# Patient Record
Sex: Female | Born: 1955 | Race: White | Hispanic: No | Marital: Married | Smoking: Never smoker
Health system: Southern US, Community
[De-identification: ages and names within clinical notes are randomized; demographics above are authoritative.]

## PROBLEM LIST (undated history)

## (undated) DIAGNOSIS — G4730 Sleep apnea, unspecified: Secondary | ICD-10-CM

## (undated) DIAGNOSIS — E785 Hyperlipidemia, unspecified: Secondary | ICD-10-CM

## (undated) DIAGNOSIS — I10 Essential (primary) hypertension: Secondary | ICD-10-CM

## (undated) HISTORY — DX: Sleep apnea, unspecified: G47.30

## (undated) HISTORY — DX: Hyperlipidemia, unspecified: E78.5

## (undated) HISTORY — PX: CHOLECYSTECTOMY: SHX55

## (undated) HISTORY — DX: Morbid (severe) obesity due to excess calories: E66.01

## (undated) HISTORY — PX: CATARACT EXTRACTION: SUR2

## (undated) HISTORY — DX: Essential (primary) hypertension: I10

## (undated) HISTORY — PX: OTHER SURGICAL HISTORY: SHX169

---

## 2002-03-22 DIAGNOSIS — E785 Hyperlipidemia, unspecified: Secondary | ICD-10-CM

## 2002-03-22 DIAGNOSIS — I10 Essential (primary) hypertension: Secondary | ICD-10-CM

## 2002-03-22 HISTORY — DX: Hyperlipidemia, unspecified: E78.5

## 2002-03-22 HISTORY — DX: Essential (primary) hypertension: I10

## 2003-01-10 ENCOUNTER — Other Ambulatory Visit: Admission: RE | Admit: 2003-01-10 | Discharge: 2003-01-10 | Payer: Self-pay | Admitting: Family Medicine

## 2004-01-15 ENCOUNTER — Ambulatory Visit: Payer: Self-pay | Admitting: Family Medicine

## 2004-01-21 ENCOUNTER — Ambulatory Visit: Payer: Self-pay | Admitting: Professional

## 2004-02-07 ENCOUNTER — Ambulatory Visit: Payer: Self-pay | Admitting: Family Medicine

## 2004-02-11 ENCOUNTER — Ambulatory Visit: Payer: Self-pay | Admitting: Professional

## 2004-04-02 ENCOUNTER — Ambulatory Visit: Payer: Self-pay | Admitting: Family Medicine

## 2004-04-09 ENCOUNTER — Ambulatory Visit: Payer: Self-pay | Admitting: Family Medicine

## 2004-05-28 ENCOUNTER — Ambulatory Visit: Payer: Self-pay | Admitting: Family Medicine

## 2004-09-03 ENCOUNTER — Ambulatory Visit: Payer: Self-pay | Admitting: Family Medicine

## 2005-06-02 ENCOUNTER — Ambulatory Visit: Payer: Self-pay | Admitting: Family Medicine

## 2005-11-02 ENCOUNTER — Encounter: Payer: Self-pay | Admitting: Family Medicine

## 2005-11-02 ENCOUNTER — Other Ambulatory Visit: Admission: RE | Admit: 2005-11-02 | Discharge: 2005-11-02 | Payer: Self-pay | Admitting: Family Medicine

## 2005-11-02 ENCOUNTER — Encounter (INDEPENDENT_AMBULATORY_CARE_PROVIDER_SITE_OTHER): Payer: Self-pay | Admitting: *Deleted

## 2005-11-02 ENCOUNTER — Ambulatory Visit: Payer: Self-pay | Admitting: Family Medicine

## 2007-01-03 ENCOUNTER — Encounter (INDEPENDENT_AMBULATORY_CARE_PROVIDER_SITE_OTHER): Payer: Self-pay | Admitting: *Deleted

## 2007-10-25 ENCOUNTER — Encounter (INDEPENDENT_AMBULATORY_CARE_PROVIDER_SITE_OTHER): Payer: Self-pay | Admitting: *Deleted

## 2008-11-20 ENCOUNTER — Ambulatory Visit: Payer: Self-pay | Admitting: Family Medicine

## 2008-11-20 ENCOUNTER — Other Ambulatory Visit: Admission: RE | Admit: 2008-11-20 | Discharge: 2008-11-20 | Payer: Self-pay | Admitting: Family Medicine

## 2008-11-20 ENCOUNTER — Encounter: Payer: Self-pay | Admitting: Family Medicine

## 2008-11-20 ENCOUNTER — Encounter (INDEPENDENT_AMBULATORY_CARE_PROVIDER_SITE_OTHER): Payer: Self-pay | Admitting: *Deleted

## 2008-11-20 DIAGNOSIS — E785 Hyperlipidemia, unspecified: Secondary | ICD-10-CM

## 2008-11-20 DIAGNOSIS — R739 Hyperglycemia, unspecified: Secondary | ICD-10-CM

## 2008-11-22 LAB — CONVERTED CEMR LAB
AST: 59 units/L — ABNORMAL HIGH (ref 0–37)
Alkaline Phosphatase: 64 units/L (ref 39–117)
Basophils Absolute: 0.1 10*3/uL (ref 0.0–0.1)
Bilirubin, Direct: 0 mg/dL (ref 0.0–0.3)
CO2: 28 meq/L (ref 19–32)
Calcium: 9.1 mg/dL (ref 8.4–10.5)
Cholesterol: 166 mg/dL (ref 0–200)
Creatinine, Ser: 0.9 mg/dL (ref 0.4–1.2)
Creatinine,U: 157.7 mg/dL
Direct LDL: 96.7 mg/dL
Eosinophils Absolute: 0.2 10*3/uL (ref 0.0–0.7)
GFR calc non Af Amer: 69.15 mL/min (ref >60)
Glucose, Bld: 219 mg/dL — ABNORMAL HIGH (ref 70–99)
HDL: 37 mg/dL — ABNORMAL LOW (ref >39.00)
Lymphocytes Relative: 25.6 % (ref 12.0–46.0)
MCHC: 34.3 g/dL (ref 30.0–36.0)
Microalb Creat Ratio: 19.7 mg/g (ref 0.0–30.0)
Microalb, Ur: 3.1 mg/dL — ABNORMAL HIGH (ref 0.0–1.9)
Monocytes Relative: 5.4 % (ref 3.0–12.0)
Neutrophils Relative %: 65.1 % (ref 43.0–77.0)
RBC: 4.78 M/uL (ref 3.87–5.11)
RDW: 13.6 % (ref 11.5–14.6)
Sodium: 139 meq/L (ref 135–145)
Total CHOL/HDL Ratio: 4
Triglycerides: 253 mg/dL — ABNORMAL HIGH (ref 0.0–149.0)

## 2008-11-27 ENCOUNTER — Encounter (INDEPENDENT_AMBULATORY_CARE_PROVIDER_SITE_OTHER): Payer: Self-pay | Admitting: *Deleted

## 2008-12-02 ENCOUNTER — Ambulatory Visit: Payer: Self-pay | Admitting: Family Medicine

## 2008-12-04 ENCOUNTER — Encounter: Payer: Self-pay | Admitting: Family Medicine

## 2008-12-11 ENCOUNTER — Encounter (INDEPENDENT_AMBULATORY_CARE_PROVIDER_SITE_OTHER): Payer: Self-pay | Admitting: *Deleted

## 2009-02-24 ENCOUNTER — Ambulatory Visit: Payer: Self-pay | Admitting: Family Medicine

## 2009-02-25 LAB — CONVERTED CEMR LAB
AST: 25 units/L (ref 0–37)
CO2: 29 meq/L (ref 19–32)
Chloride: 104 meq/L (ref 96–112)
Cholesterol: 137 mg/dL (ref 0–200)
Creatinine, Ser: 0.8 mg/dL (ref 0.4–1.2)
GFR calc non Af Amer: 79.14 mL/min (ref >60)
Hgb A1c MFr Bld: 7.7 % — ABNORMAL HIGH (ref 4.6–6.5)
Potassium: 4.3 meq/L (ref 3.5–5.1)
Sodium: 141 meq/L (ref 135–145)

## 2010-02-06 ENCOUNTER — Ambulatory Visit: Payer: Self-pay | Admitting: Family Medicine

## 2010-02-09 LAB — CONVERTED CEMR LAB
ALT: 26 units/L (ref 0–35)
Albumin: 3.7 g/dL (ref 3.5–5.2)
Alkaline Phosphatase: 62 units/L (ref 39–117)
Bilirubin, Direct: 0.1 mg/dL (ref 0.0–0.3)
Calcium: 8.9 mg/dL (ref 8.4–10.5)
Cholesterol: 153 mg/dL (ref 0–200)
Glucose, Bld: 289 mg/dL — ABNORMAL HIGH (ref 70–99)
HDL: 38 mg/dL — ABNORMAL LOW (ref >39.00)
Potassium: 4.2 meq/L (ref 3.5–5.1)
Total Protein: 6.9 g/dL (ref 6.0–8.3)

## 2010-04-21 NOTE — Assessment & Plan Note (Signed)
Summary: MED REFILL/RI   Vital Signs:  Patient profile:   55 year old female Height:      67 inches Weight:      320.75 pounds BMI:     50.42 Temp:     98.2 degrees F oral Pulse rate:   84 / minute Pulse rhythm:   regular BP sitting:   124 / 68  (left arm) Cuff size:   large  Vitals Entered By: Lewanda Rife LPN (February 06, 2010 8:10 AM) CC: med refill   History of Present Illness: here for f/u of DM and lipids and obesity   wt is down 14 lb is more active and cutting back on diet  got rid of sweet tea    DM - is not checking diet  glucophage gave her diarrhea  has not been proactive about her diabetes  was in denial for a while  is not so much now   no numbness no blurry vision  has not had eye exam in past year (sees Dr Clydene Pugh)     124/68  got flu shot today   is doing well overall- feels good       Allergies (verified): No Known Drug Allergies  Past History:  Past Medical History: Last updated: 11/20/2008 Hypertension (2004) Diabetes mellitus, type II (2004) Hyperlipidemia (2004) Obesity- morbid family hx colon cancer/ and pt declines colonosc 2010  Past Surgical History: Last updated: 11/20/2008 C-S  Family History: Last updated: 11/20/2008 Father- Diabetes Mother- Diabetes, Colon CA, Schizophrenia, Died of COPD (wasSmoker) Brother- Diabetes Sister- Ovarian CA in her 64's Grandfather- ETOH  Social History: Last updated: 11/20/2008 Occupation: [employer] Secretary Never Smoked Alcohol use-no Drug use-no Married  Risk Factors: Smoking Status: never (11/20/2008)  Review of Systems General:  Denies fatigue, loss of appetite, and malaise. Eyes:  Denies blurring and eye irritation. CV:  Denies chest pain or discomfort, palpitations, and shortness of breath with exertion. Resp:  Denies cough, shortness of breath, and wheezing. GI:  Denies abdominal pain and change in bowel habits. MS:  Denies joint pain and joint swelling. Derm:   Denies itching, lesion(s), poor wound healing, and rash. Neuro:  Denies numbness and tingling. Psych:  mood is fair . Endo:  Denies excessive thirst and excessive urination. Heme:  Denies abnormal bruising and bleeding.  Physical Exam  General:  morbidly obese, well appearing Head:  normocephalic, atraumatic, and no abnormalities observed.   Eyes:  vision grossly intact, pupils equal, pupils round, and pupils reactive to light.   Mouth:  pharynx pink and moist.   Neck:  supple with full rom and no masses or thyromegally, no JVD or carotid bruit  Lungs:  Normal respiratory effort, chest expands symmetrically. Lungs are clear to auscultation, no crackles or wheezes. Heart:  Normal rate and regular rhythm. S1 and S2 normal without gallop, murmur, click, rub or other extra sounds. Abdomen:  Bowel sounds positive,abdomen soft and non-tender without masses, organomegaly or hernias noted. no renal bruits  Msk:  No deformity or scoliosis noted of thoracic or lumbar spine.   Pulses:  R and L carotid,radial,femoral,dorsalis pedis and posterior tibial pulses are full and equal bilaterally Extremities:  No clubbing, cyanosis, edema, or deformity noted with normal full range of motion of all joints.   Neurologic:  sensation intact to light touch, gait normal, and DTRs symmetrical and normal.   Skin:  very dry skin on feet Cervical Nodes:  No lymphadenopathy noted Inguinal Nodes:  No significant adenopathy Psych:  seems  mildly frustrated but overall affect is normal  Diabetes Management Exam:    Foot Exam (with socks and/or shoes not present):       Sensory-Pinprick/Light touch:          Left medial foot (L-4): normal          Left dorsal foot (L-5): normal          Left lateral foot (S-1): normal          Right medial foot (L-4): normal          Right dorsal foot (L-5): normal          Right lateral foot (S-1): normal       Sensory-Monofilament:          Left foot: normal          Right foot:  normal       Inspection:          Left foot: normal          Right foot: normal       Nails:          Left foot: normal          Right foot: normal   Impression & Recommendations:  Problem # 1:  HYPERLIPIDEMIA (ICD-272.4) Assessment Unchanged  diet fair with some wt loss check labs on crestor disc goals for DM  rev low sat fat diet  f/u in 3 mo Her updated medication list for this problem includes:    Crestor 10 Mg Tabs (Rosuvastatin calcium) ..... One by mouth once daily  Orders: Venipuncture (16109) TLB-Lipid Panel (80061-LIPID) TLB-Renal Function Panel (80069-RENAL) TLB-Hepatic/Liver Function Pnl (80076-HEPATIC) TLB-A1C / Hgb A1C (Glycohemoglobin) (83036-A1C) Prescription Created Electronically [id number])  Labs Reviewed: SGOT: 25 (02/24/2009)   SGPT: 23 (02/24/2009)   HDL:39.80 (02/24/2009), 37.00 (11/20/2008)  LDL:71 (02/24/2009)  Chol:137 (02/24/2009), 166 (11/20/2008)  Trig:133.0 (02/24/2009), 253.0 (11/20/2008)  Problem # 2:  DIABETES MELLITUS, TYPE II (ICD-250.00) Assessment: Deteriorated pt has been in denial about DM, stopped med due to diarrhea (but did loose 14 lb with better diet) AIC today on ace  reminded to get eye exam  change to glucotrol xl check sugar once daily -- see inst  f/u 3 mo  The following medications were removed from the medication list:    Glucophage 500 Mg Tabs (Metformin hcl) .Marland Kitchen... 1 by mouth two times a day Her updated medication list for this problem includes:    Lisinopril 5 Mg Tabs (Lisinopril) ..... One by mouth daily    Glucotrol Xl 5 Mg Xr24h-tab (Glipizide) .Marland Kitchen... 1 by mouth once daily  Orders: Venipuncture (09811) TLB-Lipid Panel (80061-LIPID) TLB-Renal Function Panel (80069-RENAL) TLB-Hepatic/Liver Function Pnl (80076-HEPATIC) TLB-A1C / Hgb A1C (Glycohemoglobin) (83036-A1C) Prescription Created Electronically [id number])  Complete Medication List: 1)  Lisinopril 5 Mg Tabs (Lisinopril) .... One by mouth daily 2)  Crestor 10  Mg Tabs (Rosuvastatin calcium) .... One by mouth once daily 3)  Glucotrol Xl 5 Mg Xr24h-tab (Glipizide) .Marland Kitchen.. 1 by mouth once daily  Other Orders: Admin 1st Vaccine (29562) Flu Vaccine 54yrs + [phone])  Patient Instructions: 1)  don't forget to make your appt with eye doctor for your annual diabetic eye exam  2)  try to stick to diabetic diet  3)  It is important that you exercise reguarly at least 20 minutes 5 times a week. If you develop chest pain, have severe difficulty breathing, or feel very tired, stop exercising immediately and seek medical attention.  4)  stay  off the metformin  5)  start the glucotrol XL 5 mg each am  6)  check sugar in am fasting MWF and 2 hours after a meal on tues/ thurs/ sat /sunday 7)  bring blood sugar readings at follow up  8)  flu shot today 9)  labs today 10)  schedule non fasting lab in 3 months and then follow up AIC and renal Prescriptions: GLUCOTROL XL 5 MG XR24H-TAB (GLIPIZIDE) 1 by mouth once daily  #30 x 3   Entered and Authorized by:   Marne Ann Tower MD   Signed by:   Marne Ann Tower MD on 02/06/2010   Method used:   [facility]  [address] [id number]       [address]       [place]       [place], [place]  [address]       Ph: [phone]       Fax: [phone]   RxID:   [id number] CRESTOR 10 MG TABS (ROSUVASTATIN CALCIUM) one by mouth once daily  #90 x 3   Entered and Authorized by:   Marne Ann Tower MD   Signed by:   Marne Ann Tower MD on 02/06/2010   Method used:   [facility]        [facility]  [address] [id number]       [address]       [place]       [place], [place]  [address]       Ph: [phone]       Fax: [phone]   RxID:   [id number] LISINOPRIL 5 MG TABS (LISINOPRIL) one by mouth daily  #90 x 3   Entered and Authorized by:   Marne Ann Tower MD   Signed by:   Marne Ann Tower MD on 02/06/2010   Method used:   [facility]        [facility]  [address] [id number]       [address]       [place], [place]  [address]       Ph: [phone]       Fax: [phone]   RxID:   [id number]    Orders Added: 1)  Venipuncture [44010] 2)  TLB-Lipid Panel [80061-LIPID] 3)  TLB-Renal Function Panel [80069-RENAL] 4)  TLB-Hepatic/Liver Function Pnl [80076-HEPATIC] 5)  TLB-A1C / Hgb A1C (Glycohemoglobin) [83036-A1C] 6)  Admin 1st Vaccine [90471] 7)  Flu Vaccine 50yrs + [[address]] 8)  Prescription Created Electronically [G8553] 9)  Est. Patient Level IV [66440]    Current Allergies (reviewed today): No known allergies  Flu Vaccine Consent Questions     Do you have a history of severe allergic reactions to this vaccine? no    Any prior history of allergic reactions to egg and/or gelatin? no    Do you have a sensitivity to the preservative Thimersol? no    Do you have a past history of Guillan-Barre Syndrome? no    Do you currently have an acute febrile illness? no    Have you ever had a severe reaction to latex? no    Vaccine information given and explained to patient? yes    Are you currently pregnant? no    Lot Number:[id number]   Exp Date:09/19/2010   Site Given  Left Deltoid IM Lewanda Rife LPN  February 06, 2010 9:15 AM  .[email or web address]

## 2010-05-05 ENCOUNTER — Encounter (INDEPENDENT_AMBULATORY_CARE_PROVIDER_SITE_OTHER): Payer: Self-pay | Admitting: *Deleted

## 2010-05-05 ENCOUNTER — Other Ambulatory Visit: Payer: Self-pay | Admitting: Family Medicine

## 2010-05-05 ENCOUNTER — Other Ambulatory Visit (INDEPENDENT_AMBULATORY_CARE_PROVIDER_SITE_OTHER): Payer: BC Managed Care – PPO

## 2010-05-05 DIAGNOSIS — E119 Type 2 diabetes mellitus without complications: Secondary | ICD-10-CM

## 2010-05-05 DIAGNOSIS — E785 Hyperlipidemia, unspecified: Secondary | ICD-10-CM

## 2010-05-05 LAB — RENAL FUNCTION PANEL
Albumin: 3.4 g/dL — ABNORMAL LOW (ref 3.5–5.2)
Calcium: 9 mg/dL (ref 8.4–10.5)
Creatinine, Ser: 0.7 mg/dL (ref 0.4–1.2)
Glucose, Bld: 203 mg/dL — ABNORMAL HIGH (ref 70–99)
Sodium: 139 mEq/L (ref 135–145)

## 2010-05-12 ENCOUNTER — Ambulatory Visit: Payer: Self-pay | Admitting: Family Medicine

## 2010-05-14 ENCOUNTER — Ambulatory Visit: Payer: Self-pay | Admitting: Family Medicine

## 2010-05-15 ENCOUNTER — Ambulatory Visit: Payer: Self-pay | Admitting: Family Medicine

## 2010-05-25 ENCOUNTER — Ambulatory Visit (INDEPENDENT_AMBULATORY_CARE_PROVIDER_SITE_OTHER): Payer: BC Managed Care – PPO | Admitting: Family Medicine

## 2010-05-25 ENCOUNTER — Encounter: Payer: Self-pay | Admitting: Family Medicine

## 2010-05-25 DIAGNOSIS — E119 Type 2 diabetes mellitus without complications: Secondary | ICD-10-CM

## 2010-06-09 NOTE — Assessment & Plan Note (Signed)
Summary: FOLLOW UP LABS/RI R/S FROM 05/14/10   Vital Signs:  Patient profile:   55 year old female Height:      67 inches Weight:      326.25 pounds BMI:     51.28 Temp:     98.4 degrees F oral Pulse rate:   76 / minute Pulse rhythm:   regular BP sitting:   134 / 68  (left arm) Cuff size:   large  Vitals Entered By: Lewanda Rife LPN (May 24, 1608 8:35 AM)  Serial Vital Signs/Assessments:  Time      Position  BP       Pulse  Resp  Temp     By                     130/65                         Judith Part MD  CC: follow-up visit after labs, Lipid Management   History of Present Illness: here for f/u of DM   is feeling good   wt is up 6 lb  bp is 134/68 on first check  sugar 203 on fasting check AIC is 8.9 down from 11 last check !  on glucotrol xl 5 mg now (non tol of metformin)  is checking sugars intermittenty -- avg 168-200   is doing better with diet than 6 months ago  thinks she is 40 % of the way to a diabetic diet still having trouble with motivation  eating out is her biggest downfall    is walking at work now -- every day -- 20-30 minutes (5 days per week)  more than 10 people doing it  plans to do a charity walk for [place] in april  she can do it and gaining confidence   has not had her eye exam yet -- is getting new ins at work   Lipid Management History:      Positive NCEP/ATP III risk factors include female age 73 years old or older, diabetes, HDL cholesterol less than 40, and hypertension.  Negative NCEP/ATP III risk factors include non-tobacco-user status.    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 Eyes:  Denies blurring and eye irritation. CV:  Denies chest pain or discomfort, lightheadness, and palpitations. Resp:  Denies cough, shortness of breath, and wheezing. GI:  Denies abdominal pain, change in bowel habits, indigestion, and nausea. MS:  Denies muscle aches, cramps, and muscle weakness. Derm:  Denies poor wound healing and rash. Neuro:  Denies numbness and tingling. Heme:  Denies abnormal bruising and bleeding.  Physical Exam  General:  morbidly obese, well appearing Head:  normocephalic, atraumatic, and no abnormalities observed.   Eyes:  vision grossly intact, pupils equal, pupils round, and pupils reactive to light.   Mouth:  pharynx pink and  moist.   Neck:  supple with full rom and no masses or thyromegally, no JVD or carotid bruit  Lungs:  Normal respiratory effort, chest expands symmetrically. Lungs are clear to auscultation, no crackles or wheezes. Heart:  Normal rate and regular rhythm. S1 and S2 normal without gallop, murmur, click, rub or other extra sounds. Pulses:  R and L carotid,radial,femoral,dorsalis pedis and posterior tibial pulses are full and equal bilaterally Extremities:  No clubbing, cyanosis, edema, or deformity noted with normal full range of motion of all joints.   Neurologic:  sensation intact to light touch, gait normal, and DTRs symmetrical and normal.   Skin:  Intact without suspicious lesions or rashes Cervical Nodes:  No lymphadenopathy noted Psych:  normal affect, talkative and pleasant   Diabetes Management Exam:    Foot Exam (with socks and/or shoes  not present):       Sensory-Pinprick/Light touch:          Left medial foot (L-4): normal          Left dorsal foot (L-5): normal          Left lateral foot (S-1): normal          Right medial foot (L-4): normal          Right dorsal foot (L-5): normal          Right lateral foot (S-1): normal       Sensory-Monofilament:          Left foot: normal          Right foot: normal       Inspection:          Left foot: normal          Right foot: normal       Nails:          Left foot: normal          Right foot: normal   Impression & Recommendations:  Problem # 1:  DIABETES MELLITUS, TYPE II (ICD-250.00) Assessment Improved  this is improving - still not at goal inc glucotrol to 10  again long conversation about diet and accountability  great exercise- urged to keep it up  pt will make own opthy appt  f/u 3 mo after labs  Her updated medication list for this problem includes:    Lisinopril 5 Mg Tabs (Lisinopril) ..... One by mouth daily    Glucotrol Xl 10 Mg Xr24h-tab (Glipizide) .Marland Kitchen... 1 by mouth once daily  Labs Reviewed: Creat: 0.7 (05/05/2010)    Reviewed HgBA1c results: 8.9 (05/05/2010)  11.0 (02/06/2010)  Orders: Prescription Created Electronically [id number])  Complete Medication List: 1)  Lisinopril 5 Mg Tabs (Lisinopril) .... One by mouth daily 2)  Crestor 10 Mg Tabs (Rosuvastatin calcium) .... One by mouth once daily 3)  Glucotrol Xl 10 Mg Xr24h-tab (Glipizide) .Marland Kitchen.. 1 by mouth once daily  Lipid Assessment/Plan:      Based on NCEP/ATP III, the patient's risk factor category is "history of diabetes".  The patient's lipid goals are as follows: Total cholesterol goal is 200; LDL cholesterol goal is 100; HDL cholesterol goal is 40; Triglyceride goal is 150.     Patient Instructions: 1)  please make sure to make an annual eye exam appt  2)  try your best to stick to diabetic diet  3)  stay accountable for what you eat  4)  be proud of your exercise and keep it up  5)   check sugar two  times a day -- am fasting and 2 hours after afternoon or evening meal  6)  keep a log 7)  you may also want to log of what you eat  8)  increase your glucotrol from 5 to 10 mg once per day  9)  schedule lab in 3 months AIC please and glucose 250.0 and then follow up  Prescriptions: GLUCOTROL XL 10 MG XR24H-TAB (GLIPIZIDE) 1 by mouth once daily  #30 x 11   Entered and Authorized by:   Judith Part MD   Signed by:   Judith Part MD on 05/25/2010   Method used:   [facility]        [phone][facility] ([facility]ail[facility]       [address]       [place], [place]  [address]       Ph: [phone]       Fax: [phone]   RxID:   [id number]    Orders Added: 1)  Est. Patient Level III [24401] 2)  Prescription Created Electronically [id number]    Current Allergies (reviewed today): METFORMIN HCL

## 2010-06-10 ENCOUNTER — Other Ambulatory Visit: Payer: Self-pay | Admitting: Family Medicine

## 2010-06-12 NOTE — Telephone Encounter (Signed)
Pt seen on 05/25/10 and Glipizide 5mg  was d/c. New rx for Glipizide 10mg  [facility] and [facility] 5mg .

## 2010-08-30 ENCOUNTER — Encounter: Payer: Self-pay | Admitting: Family Medicine

## 2010-09-01 ENCOUNTER — Other Ambulatory Visit: Payer: Self-pay | Admitting: Family Medicine

## 2010-09-03 ENCOUNTER — Other Ambulatory Visit (INDEPENDENT_AMBULATORY_CARE_PROVIDER_SITE_OTHER): Payer: BC Managed Care – PPO

## 2010-09-03 DIAGNOSIS — E119 Type 2 diabetes mellitus without complications: Secondary | ICD-10-CM

## 2010-09-03 LAB — GLUCOSE, RANDOM: Glucose, Bld: 203 mg/dL — ABNORMAL HIGH (ref 70–99)

## 2010-09-03 LAB — HEMOGLOBIN A1C: Hgb A1c MFr Bld: 9.2 % — ABNORMAL HIGH (ref 4.6–6.5)

## 2010-09-08 ENCOUNTER — Ambulatory Visit: Payer: BC Managed Care – PPO | Admitting: Family Medicine

## 2010-10-09 ENCOUNTER — Ambulatory Visit: Payer: BC Managed Care – PPO | Admitting: Family Medicine

## 2011-04-06 ENCOUNTER — Other Ambulatory Visit: Payer: Self-pay | Admitting: Internal Medicine

## 2011-04-06 MED ORDER — ROSUVASTATIN CALCIUM 10 MG PO TABS: 10.0000 mg | ORAL_TABLET | Freq: Every day | ORAL | Status: DC

## 2011-04-06 MED ORDER — LISINOPRIL 5 MG PO TABS: 5.0000 mg | ORAL_TABLET | Freq: Every day | ORAL | Status: DC

## 2011-04-06 NOTE — Telephone Encounter (Signed)
Will refill electronically  

## 2011-04-06 NOTE — Telephone Encounter (Signed)
Patient is out of meds and setup an appointment on 05/02/10 and wanted to know if she could get a refill.

## 2011-05-04 ENCOUNTER — Ambulatory Visit: Payer: BC Managed Care – PPO | Admitting: Family Medicine

## 2011-05-28 ENCOUNTER — Other Ambulatory Visit: Payer: Self-pay | Admitting: Family Medicine

## 2011-05-28 NOTE — Telephone Encounter (Signed)
[facility] Glipizide 10 mg 24 hr tab #30 x 0. Pt last seen 05/25/10 and already has scheduled appt 06/08/11 with Dr Milinda Antis. Dr Milinda Antis is out of office today.

## 2011-06-04 ENCOUNTER — Other Ambulatory Visit: Payer: Self-pay | Admitting: Family Medicine

## 2011-06-04 NOTE — Telephone Encounter (Signed)
[facility] crestor 10 mg #30 x 1 pt already has scheduled appt 06/08/11.

## 2011-06-08 ENCOUNTER — Ambulatory Visit: Payer: BC Managed Care – PPO | Admitting: Family Medicine

## 2011-06-14 ENCOUNTER — Ambulatory Visit (INDEPENDENT_AMBULATORY_CARE_PROVIDER_SITE_OTHER): Payer: BC Managed Care – PPO | Admitting: Family Medicine

## 2011-06-14 ENCOUNTER — Encounter: Payer: Self-pay | Admitting: Family Medicine

## 2011-06-14 VITALS — BP 122/82 | HR 82 | Temp 97.7°F | Ht 68.0 in | Wt 330.2 lb

## 2011-06-14 DIAGNOSIS — H6120 Impacted cerumen, unspecified ear: Secondary | ICD-10-CM

## 2011-06-14 DIAGNOSIS — E119 Type 2 diabetes mellitus without complications: Secondary | ICD-10-CM

## 2011-06-14 DIAGNOSIS — E785 Hyperlipidemia, unspecified: Secondary | ICD-10-CM

## 2011-06-14 DIAGNOSIS — E669 Obesity, unspecified: Secondary | ICD-10-CM

## 2011-06-14 LAB — COMPREHENSIVE METABOLIC PANEL
ALT: 18 U/L (ref 0–35)
AST: 17 U/L (ref 0–37)
Albumin: 3.6 g/dL (ref 3.5–5.2)
Alkaline Phosphatase: 63 U/L (ref 39–117)
Glucose, Bld: 270 mg/dL — ABNORMAL HIGH (ref 70–99)
Potassium: 4 mEq/L (ref 3.5–5.1)
Sodium: 139 mEq/L (ref 135–145)
Total Protein: 7.2 g/dL (ref 6.0–8.3)

## 2011-06-14 LAB — LIPID PANEL
HDL: 43.9 mg/dL (ref >39.00)
LDL Cholesterol: 60 mg/dL (ref 0–99)
VLDL: 35.8 mg/dL (ref 0.0–40.0)

## 2011-06-14 MED ORDER — ROSUVASTATIN CALCIUM 10 MG PO TABS: 10.0000 mg | ORAL_TABLET | Freq: Every day | ORAL | Status: DC

## 2011-06-14 MED ORDER — LISINOPRIL 5 MG PO TABS: 5.0000 mg | ORAL_TABLET | Freq: Every day | ORAL | Status: DC

## 2011-06-14 MED ORDER — GLIPIZIDE ER 10 MG PO TB24: 10.0000 mg | ORAL_TABLET | Freq: Every day | ORAL | Status: DC

## 2011-06-14 NOTE — Patient Instructions (Signed)
I suspect your diabetes is poorly controlled  Check sugar am fasting and then 2 hours after evening meal  Keep a log and bring to next appt  Use debrox solution over the counter in both ears twice weekly to loosen ear wax Follow up in 2-3 weeks - we will rev labs/ sugars and flush your ears Aim for 5 days per week of exercise for 30 minutes  Work on weight loss

## 2011-06-14 NOTE — Assessment & Plan Note (Signed)
Pt inst to use debrox twice weekly and will irrigate ears at upcoming f/u  Is affecting her hearing

## 2011-06-14 NOTE — Assessment & Plan Note (Signed)
Suspect poor control Lab today  F/yu 2-3 wk with sugars Pt is not very motivated for lifestyle change Glipizide refilled

## 2011-06-14 NOTE — Assessment & Plan Note (Signed)
Has been fairly controlled with crestor and diet  Non compliant in general  Lab today Disc at f/u 2-3 wk

## 2011-06-14 NOTE — Assessment & Plan Note (Signed)
Discussed how this problem influences overall health and the risks it imposes  Reviewed plan for weight loss with lower calorie diet (via better food choices and also portion control or program like weight watchers) and exercise building up to or more than 30 minutes 5 days per week including some aerobic activity    Pt voices understanding - does not seem very motivated, however

## 2011-06-14 NOTE — Progress Notes (Signed)
Subjective:    Patient ID: Sara Glover, female    DOB: 1955-10-26, 56 y.o.   MRN: [id number]  HPI Here for f/u of chronic problems incl DM and chol and obesity Is doing well  Has wax build up in ears - trouble with it   Wt is up 4 lb  bmi of 50 - morbid obesity Lifestyle habits-- is not ready to make commitment  Has tried some programs    On glipizide ER  Diabetes Home sugar results - does not check sugars (has the equiptment)  DM diet -- eats about 1/2 the time  Exercise - not much at all  Symptoms-- none  A1C last - was in June 9.2 poor control  No problems with medications (pt cannot take metformin)  Renal protection- is on ace - lisinopril 5 mg  Last eye exam -was in the middle of last year imms- did not get flu shot this year Pneumovax - needs to get  Chol Lab Results  Component Value Date   CHOL 153 02/06/2010   HDL 38.00* 02/06/2010   LDLCALC 71 02/24/2009   LDLDIRECT 87.3 02/06/2010   TRIG 217.0* 02/06/2010   CHOLHDL 4 02/06/2010    On crestor- had been well controlled  Diet-- is fair overall  Overdue for labs   Planning to start walking at work  Has hallways to walk in for bad weather Also treadmill  Patient Active Problem List  Diagnoses  . HYPERLIPIDEMIA  . DIABETES MELLITUS, TYPE II  . Obesity  . Cerumen impaction   Past Medical History  Diagnosis Date  . Diabetes mellitus 2004    type II  . Hyperlipidemia 2004  . Hypertension 2004  . Morbid obesity    Past Surgical History  Procedure Date  . Cesarean section    History  Substance Use Topics  . Smoking status: Never Smoker   . Smokeless tobacco: Not on file  . Alcohol Use: No   Family History  Problem Relation Age of Onset  . Diabetes Mother   . Cancer Mother     colon CA  . Schizophrenia Mother   . Diabetes Father   . Cancer Sister     ovarian CA in 11s  . Diabetes Brother    Allergies  Allergen Reactions  . Metformin     REACTION: diarrhea   No current outpatient  prescriptions on file prior to visit.        Review of Systems Review of Systems  Constitutional: Negative for fever, appetite change, fatigue and unexpected weight change.  Eyes: Negative for pain and visual disturbance.  Respiratory: Negative for cough and shortness of breath.   Cardiovascular: Negative for cp or palpitations    Gastrointestinal: Negative for nausea, diarrhea and constipation.  Genitourinary: Negative for urgency and frequency. no excessive thirst  Skin: Negative for pallor or rash   Neurological: Negative for weakness, light-headedness, numbness and headaches.  Hematological: Negative for adenopathy. Does not bruise/bleed easily.  Psychiatric/Behavioral: Negative for dysphoric mood. The patient is not nervous/anxious.          Objective:   Physical Exam  Constitutional: She appears well-developed and well-nourished. No distress.       Morbidly obese and well appearing   HENT:  Head: Normocephalic and atraumatic.  Mouth/Throat: Oropharynx is clear and moist.        Bilateral cerumen impaction that appears to be deep  Eyes: Conjunctivae and EOM are normal. Pupils are equal, round, and reactive to light.  No scleral icterus.  Neck: Normal range of motion. Neck supple. No JVD present. Carotid bruit is not present. No thyromegaly present.  Cardiovascular: Normal rate, regular rhythm, normal heart sounds and intact distal pulses.  Exam reveals no gallop.   Pulmonary/Chest: Effort normal and breath sounds normal. No respiratory distress. She has no wheezes.  Abdominal: Soft. Bowel sounds are normal. She exhibits no distension, no abdominal bruit and no mass. There is no tenderness.  Musculoskeletal: Normal range of motion. She exhibits edema. She exhibits no tenderness.       Trace pedal edema   Lymphadenopathy:    She has no cervical adenopathy.  Neurological: She is alert. She has normal reflexes. No cranial nerve deficit. She exhibits normal muscle tone.  Coordination normal.  Skin: Skin is warm and dry. No rash noted. No erythema. No pallor.  Psychiatric: She has a normal mood and affect.          Assessment & Plan:

## 2011-06-15 ENCOUNTER — Other Ambulatory Visit: Payer: Self-pay | Admitting: Family Medicine

## 2011-07-01 ENCOUNTER — Other Ambulatory Visit: Payer: Self-pay | Admitting: Family Medicine

## 2011-07-06 ENCOUNTER — Ambulatory Visit (INDEPENDENT_AMBULATORY_CARE_PROVIDER_SITE_OTHER): Payer: BC Managed Care – PPO | Admitting: Family Medicine

## 2011-07-06 ENCOUNTER — Encounter: Payer: Self-pay | Admitting: Family Medicine

## 2011-07-06 ENCOUNTER — Telehealth: Payer: Self-pay

## 2011-07-06 VITALS — BP 120/62 | HR 64 | Temp 98.0°F | Ht 68.0 in | Wt 327.8 lb

## 2011-07-06 DIAGNOSIS — E669 Obesity, unspecified: Secondary | ICD-10-CM

## 2011-07-06 DIAGNOSIS — E119 Type 2 diabetes mellitus without complications: Secondary | ICD-10-CM

## 2011-07-06 DIAGNOSIS — E785 Hyperlipidemia, unspecified: Secondary | ICD-10-CM

## 2011-07-06 DIAGNOSIS — H6120 Impacted cerumen, unspecified ear: Secondary | ICD-10-CM

## 2011-07-06 NOTE — Telephone Encounter (Signed)
Pt said she did not remember the name of the med she was to ck with insurance co about. Explained to contact insurance co and see which is more affordable, Byetta or Lantus and then let Dr Milinda Antis know so she can make a plan. (pt said could not find discharge instruction sheet.)

## 2011-07-06 NOTE — Progress Notes (Signed)
Subjective:    Patient ID: Sara Glover, female    DOB: 07/08/1955, 56 y.o.   MRN: [id number]  HPI Here for f/u of chronic issues and cerumen impaction  Wt is down 3 lb with bmi of 49 Disc obesity in detail last time Is really watching what she eats -- no junk food and better choices  Also exercises/ more active -- walking (getting started) - 5 days , 20 or more minutes   Cerumen impaction- adv to use debrox-- using the stuff   Diabetes Home sugar results -- with new diet-- 170s in the am and 200s pp -- better  DM diet - getting closer to DM diet , and smaller portions Exercise - much better Symptoms-- none  A1C last  Lab Results  Component Value Date   HGBA1C 10.1* 06/14/2011    No problems with medications -on glipizide- does not tolerate metformin  Renal protection- on ace  Last eye exam   Lipids crestor and diet Lab Results  Component Value Date   CHOL 140 06/14/2011   HDL 43.90 06/14/2011   LDLCALC 60 06/14/2011   LDLDIRECT 87.3 02/06/2010   TRIG 179.0* 06/14/2011   CHOLHDL 3 06/14/2011     at goal   bp good   Patient Active Problem List  Diagnoses  . HYPERLIPIDEMIA  . DIABETES MELLITUS, TYPE II  . Obesity  . Cerumen impaction   Past Medical History  Diagnosis Date  . Diabetes mellitus 2004    type II  . Hyperlipidemia 2004  . Hypertension 2004  . Morbid obesity    Past Surgical History  Procedure Date  . Cesarean section    History  Substance Use Topics  . Smoking status: Never Smoker   . Smokeless tobacco: Not on file  . Alcohol Use: No   Family History  Problem Relation Age of Onset  . Diabetes Mother   . Cancer Mother     colon CA  . Schizophrenia Mother   . Diabetes Father   . Cancer Sister     ovarian CA in 87s  . Diabetes Brother    Allergies  Allergen Reactions  . Metformin     REACTION: diarrhea   Current Outpatient Prescriptions on File Prior to Visit  Medication Sig Dispense Refill  . ACCU-CHEK COMPACT TEST DRUM test strip  USE AS DIRECTED  102 each  11  . glipiZIDE (GLUCOTROL XL) 10 MG 24 hr tablet Take 1 tablet (10 mg total) by mouth daily.  30 tablet  11  . lisinopril (PRINIVIL,ZESTRIL) 5 MG tablet Take 1 tablet (5 mg total) by mouth daily.  30 tablet  11  . rosuvastatin (CRESTOR) 10 MG tablet Take 1 tablet (10 mg total) by mouth daily.  30 tablet  11      Review of Systems Review of Systems  Constitutional: Negative for fever, appetite change, fatigue and unexpected weight change.  Eyes: Negative for pain and visual disturbance.  ENT pos for blocked feeling ears and hearing loss  Respiratory: Negative for cough and shortness of breath.   Cardiovascular: Negative for cp or palpitations    Gastrointestinal: Negative for nausea, diarrhea and constipation.  Genitourinary: Negative for urgency and frequency.  Skin: Negative for pallor or rash   Neurological: Negative for weakness, light-headedness, numbness and headaches.  Hematological: Negative for adenopathy. Does not bruise/bleed easily.  Psychiatric/Behavioral: Negative for dysphoric mood. The patient is not nervous/anxious.         Objective:   Physical Exam  Constitutional: She appears well-developed and well-nourished. No distress.       Obese and well appearing   HENT:  Head: Normocephalic and atraumatic.  Right Ear: External ear normal.  Left Ear: External ear normal.  Mouth/Throat: Oropharynx is clear and moist.       Bilateral cerumen impaction that is soft  Complete resolution after simple ear irrigation   Eyes: Conjunctivae and EOM are normal. Pupils are equal, round, and reactive to light. No scleral icterus.  Neck: Normal range of motion. Neck supple. No JVD present. Carotid bruit is not present. No thyromegaly present.  Cardiovascular: Normal rate, regular rhythm, normal heart sounds and intact distal pulses.   Pulmonary/Chest: Effort normal and breath sounds normal. No respiratory distress. She has no wheezes.  Musculoskeletal: She  exhibits edema.       Trace pedal edema  Lymphadenopathy:    She has no cervical adenopathy.  Neurological: She is alert. She has normal reflexes.  Skin: Skin is warm and dry. No rash noted. No erythema. No pallor.  Psychiatric: She has a normal mood and affect.          Assessment & Plan:

## 2011-07-06 NOTE — Assessment & Plan Note (Signed)
Commended on wt loss so far 

## 2011-07-06 NOTE — Assessment & Plan Note (Signed)
Improvement with wt loss so far and also diet/ exercise Enc to keep that up  Need to start another agent in meantime- will call ins about byetta or lantus insulin and let me know

## 2011-07-06 NOTE — Patient Instructions (Signed)
Keep up the great work with healthy diet and exercise! - you are doing great  I want to start either Byetta or Lantus - injectable therapies- please check with your insurance to see which one is more affordable and call to let me know so we can get started and make a plan

## 2011-07-06 NOTE — Assessment & Plan Note (Signed)
Very good control with crestor and diet  Disc goals for lipids and reasons to control them Rev labs with pt Rev low sat fat diet in detail

## 2011-07-06 NOTE — Assessment & Plan Note (Signed)
Resolved with simple irrigation  Much imp in hearing

## 2011-07-15 MED ORDER — EXENATIDE 5 MCG/0.02ML ~~LOC~~ SOPN: 5.0000 ug | PEN_INJECTOR | Freq: Two times a day (BID) | SUBCUTANEOUS | Status: DC

## 2011-07-15 MED ORDER — INSULIN PEN NEEDLE 31G X 5 MM MISC: Status: DC

## 2011-07-15 NOTE — Telephone Encounter (Signed)
Pt said Byetta and Lantus are both Tier 2 so same co pay $40. Pt uses [facility] and pt can be reached [phone].

## 2011-07-15 NOTE — Telephone Encounter (Signed)
Addended by: Gilmer Mor on: 07/15/2011 08:58 AM   Modules accepted: Orders

## 2011-07-15 NOTE — Telephone Encounter (Signed)
Addended by: Roxy Manns A on: 07/15/2011 08:47 AM   Modules accepted: Orders

## 2011-07-15 NOTE — Telephone Encounter (Signed)
Lets go ahead and try byetta first  Will send elect to pharmacy  If the pharmacist there can not teach her how to use it , she can go to [facility] pharmacy and they can show her or make nurse visit here I do not know which size needles to order with it - so please call the pharmacy and ask- and order the appropriate number of them  Follow up in 4-6 weeks with blood sugars bid (am and 2 hours post prandial) to review and see how she is doing with it

## 2011-07-15 NOTE — Telephone Encounter (Signed)
Patient advised as instructed via telephone, [facility] and she recommended the BD Ultra fine mini needles to go along with the Byetta pen.  Patient will see if the pharmacist can teach her how to use the [facility], she will call back if she has any further questions and also to make f/u appt.

## 2011-07-20 ENCOUNTER — Ambulatory Visit (INDEPENDENT_AMBULATORY_CARE_PROVIDER_SITE_OTHER): Payer: BC Managed Care – PPO | Admitting: Family Medicine

## 2011-07-20 ENCOUNTER — Telehealth: Payer: Self-pay

## 2011-07-20 ENCOUNTER — Ambulatory Visit: Payer: Self-pay | Admitting: Family Medicine

## 2011-07-20 ENCOUNTER — Encounter: Payer: Self-pay | Admitting: Family Medicine

## 2011-07-20 VITALS — BP 144/72 | HR 78 | Temp 98.0°F | Ht 68.0 in | Wt 326.2 lb

## 2011-07-20 DIAGNOSIS — M7989 Other specified soft tissue disorders: Secondary | ICD-10-CM

## 2011-07-20 DIAGNOSIS — Z23 Encounter for immunization: Secondary | ICD-10-CM

## 2011-07-20 DIAGNOSIS — M79606 Pain in leg, unspecified: Secondary | ICD-10-CM | POA: Insufficient documentation

## 2011-07-20 DIAGNOSIS — M79609 Pain in unspecified limb: Secondary | ICD-10-CM

## 2011-07-20 MED ORDER — AMOXICILLIN-POT CLAVULANATE 875-125 MG PO TABS: 1.0000 | ORAL_TABLET | Freq: Two times a day (BID) | ORAL | Status: AC

## 2011-07-20 NOTE — Patient Instructions (Addendum)
tetanus shot today  Elevate let when you can - use warm compress on it  If worse/ fever/increased redness/ chest pain or short of breath -- notify us and seek care Take the Augmentin as directed  Follow up with me for re check on Friday please  We will schedule duplex of leg at check out

## 2011-07-20 NOTE — Telephone Encounter (Signed)
[place] of [facility] called report of leg doppler preliminary report; negative for DVT rt leg; clot in superficial vein of upper calf of rt leg(area of discomfort). Dr Milinda Antis said to tell pt report stressing this is not serious and what she expected, pt can go home and Dr Milinda Antis will update pt later. Pt notified and pt can be reached at cell [phone] or [phone].

## 2011-07-20 NOTE — Telephone Encounter (Signed)
I want her to elevate leg and use warm compress on it , take the augmentin and also add a 325 mg aspirin daily with food  F/u with me Friday as planned but alert me earlier if symptoms worsen

## 2011-07-20 NOTE — Telephone Encounter (Signed)
On 07/15/11 scratched leg with limb in yard; on 07/16/11 noticed some swelling and redness. Today vein in leg more swollen, red, warm to touch and feels tight. No drainage.Pt is at work but will elevate leg while sitting.Pt has appt today at 11:45am with Dr Milinda Antis. Pt wanted to make sure Dr Milinda Antis thought OK to wait until 11:45 to be seen. Pt can be reached at [phone] if further instruction or need to change appt. If pt does not hear from Dr Lucretia Roers nurse she will plan to see Dr Milinda Antis at 11:45am. [facility] uses [facility] if pharmacy needed.

## 2011-07-20 NOTE — Progress Notes (Signed)
Subjective:    Patient ID: Sara Glover, female    DOB: [birth year], 56 y.o.   MRN: [id number]  HPI Here with a scratch on her leg R that may be infected  Actually , in retrospect- may not be a scratch   Got pretty red and not getting better  Happened last Thursday - was outdoors- did not clean it up until Friday Is an area of bad veins  Is red / hot now and sore No fever   No drainage   Td was 07  Patient Active Problem List  Diagnoses  . HYPERLIPIDEMIA  . DIABETES MELLITUS, TYPE II  . Obesity  . Cerumen impaction  . Leg pain  . Leg swelling   Past Medical History  Diagnosis Date  . Diabetes mellitus 2004    type II  . Hyperlipidemia 2004  . Hypertension 2004  . Morbid obesity    Past Surgical History  Procedure Date  . Cesarean section    History  Substance Use Topics  . Smoking status: Never Smoker   . Smokeless tobacco: Not on file  . Alcohol Use: No   Family History  Problem Relation Age of Onset  . Diabetes Mother   . Cancer Mother     colon CA  . Schizophrenia Mother   . Diabetes Father   . Cancer Sister     ovarian CA in 32s  . Diabetes Brother    Allergies  Allergen Reactions  . Metformin     REACTION: diarrhea   Current Outpatient Prescriptions on File Prior to Visit  Medication Sig Dispense Refill  . ACCU-CHEK COMPACT TEST DRUM test strip USE AS DIRECTED  102 each  11  . exenatide (BYETTA 5 MCG PEN) 5 MCG/0.02ML SOLN Inject 0.02 mLs (5 mcg total) into the skin 2 (two) times daily with a meal.  1.2 mL  11  . glipiZIDE (GLUCOTROL XL) 10 MG 24 hr tablet Take 1 tablet (10 mg total) by mouth daily.  30 tablet  11  . Insulin Pen Needle (B-D UF III MINI PEN NEEDLES) 31G X 5 MM MISC Use as directed with Byetta Pen  100 each  11  . lisinopril (PRINIVIL,ZESTRIL) 5 MG tablet Take 1 tablet (5 mg total) by mouth daily.  30 tablet  11  . rosuvastatin (CRESTOR) 10 MG tablet Take 1 tablet (10 mg total) by mouth daily.  30 tablet  11      Review of  Systems Review of Systems  Constitutional: Negative for fever, appetite change, fatigue and unexpected weight change.  Eyes: Negative for pain and visual disturbance.  Respiratory: Negative for cough and shortness of breath.   Cardiovascular: Negative for cp or palpitations    Gastrointestinal: Negative for nausea, diarrhea and constipation.  Genitourinary: Negative for urgency and frequency.  Skin: Negative for pallor or rash  pos for redness/ tenderness on R leg MSK neg for joint swelling  Neurological: Negative for weakness, light-headedness, numbness and headaches.  Hematological: Negative for adenopathy. Does not bruise/bleed easily.  Psychiatric/Behavioral: Negative for dysphoric mood. The patient is not nervous/anxious.          Objective:   Physical Exam  Constitutional: She appears well-developed and well-nourished. No distress.       Obese and well appearing   HENT:  Head: Normocephalic and atraumatic.  Mouth/Throat: Oropharynx is clear and moist.  Eyes: Conjunctivae and EOM are normal. Pupils are equal, round, and reactive to light. No scleral icterus.  Neck: Normal  range of motion. Neck supple. No JVD present. Carotid bruit is not present. No thyromegaly present.  Cardiovascular: Normal rate, regular rhythm, normal heart sounds and intact distal pulses.  Exam reveals no gallop.   Pulmonary/Chest: Effort normal and breath sounds normal. No respiratory distress. She has no wheezes. She has no rales.  Abdominal: She exhibits no abdominal bruit.  Musculoskeletal: She exhibits edema and tenderness.  Lymphadenopathy:    She has no cervical adenopathy.  Neurological: She is alert. She has normal strength and normal reflexes. She displays no atrophy. No sensory deficit. She exhibits normal muscle tone.  Skin: Skin is warm and dry. No rash noted. There is erythema. No pallor.       R medial calf - 3-4 cm irregular area of induration/ tenderness/ warmth and redness  Has  varicosities that are compressible  Neg homann's sign  No skin interruption noted Is obese with hyperpigmentation of both shin areas- baseline   Psychiatric: She has a normal mood and affect.          Assessment & Plan:

## 2011-07-20 NOTE — Assessment & Plan Note (Signed)
Pt has area of erythema and induration on R lower leg -- ? If trauma related because no break in skin  Cellulitis/ phlebitis / DVT are all in the differential  Pt had 45 min car ride before this happened-otherwise active  Check venous duplex  tx with augmentin (fri f/u or earlier if worse) Elevation / warm compress Pt not interested in supp hose

## 2011-07-20 NOTE — Telephone Encounter (Signed)
Will see her today -- if worse in meantime or fever / etc- let me know

## 2011-07-20 NOTE — Telephone Encounter (Signed)
Patient advised.

## 2011-07-21 ENCOUNTER — Encounter: Payer: Self-pay | Admitting: Family Medicine

## 2011-07-23 ENCOUNTER — Encounter: Payer: Self-pay | Admitting: Family Medicine

## 2011-07-23 ENCOUNTER — Ambulatory Visit (INDEPENDENT_AMBULATORY_CARE_PROVIDER_SITE_OTHER): Payer: BC Managed Care – PPO | Admitting: Family Medicine

## 2011-07-23 VITALS — BP 122/68 | HR 78 | Temp 97.9°F | Ht 68.0 in | Wt 324.8 lb

## 2011-07-23 DIAGNOSIS — I749 Embolism and thrombosis of unspecified artery: Secondary | ICD-10-CM

## 2011-07-23 DIAGNOSIS — I8290 Acute embolism and thrombosis of unspecified vein: Secondary | ICD-10-CM

## 2011-07-25 DIAGNOSIS — I8290 Acute embolism and thrombosis of unspecified vein: Secondary | ICD-10-CM | POA: Insufficient documentation

## 2011-07-25 NOTE — Assessment & Plan Note (Signed)
In superficial vein RLE - no communication with deep system  Will tx with asa/ elevation / heat and finish abx in light of redness  Disc need for compression  Feel she would benefit supp hose (difficult to get on due to morbidly obese status)  Disc risk factors for clot  Red flags to watch for Update if not starting to improve in a week or if worsening   F/u planned

## 2011-07-25 NOTE — Progress Notes (Signed)
Subjective:    Patient ID: Sara Glover, female    DOB: May 16, 1955, 56 y.o.   MRN: [id number]  HPI Note originally done on paper today due to computer system outtage   Here for f/u of phlebitis/ superficial venous clot in R lower leg  Was seen and put on augmentin and sent for doppler This showed superficial venous clot- not comm with deeper vessels  Pt put on asa daily Finishing her abx  Overall improved Is morbidly obese  Unsure if she could tolerate supp hose in future   Has elevated leg and used warm compresses   Patient Active Problem List  Diagnoses  . HYPERLIPIDEMIA  . DIABETES MELLITUS, TYPE II  . Obesity  . Cerumen impaction  . Leg pain  . Leg swelling   Past Medical History  Diagnosis Date  . Diabetes mellitus 2004    type II  . Hyperlipidemia 2004  . Hypertension 2004  . Morbid obesity    Past Surgical History  Procedure Date  . Cesarean section    History  Substance Use Topics  . Smoking status: Never Smoker   . Smokeless tobacco: Not on file  . Alcohol Use: No   Family History  Problem Relation Age of Onset  . Diabetes Mother   . Cancer Mother     colon CA  . Schizophrenia Mother   . Diabetes Father   . Cancer Sister     ovarian CA in 106s  . Diabetes Brother    Allergies  Allergen Reactions  . Metformin     REACTION: diarrhea   Current Outpatient Prescriptions on File Prior to Visit  Medication Sig Dispense Refill  . ACCU-CHEK COMPACT TEST DRUM test strip USE AS DIRECTED  102 each  11  . amoxicillin-clavulanate (AUGMENTIN) 875-125 MG per tablet Take 1 tablet by mouth 2 (two) times daily.  17 tablet  0  . exenatide (BYETTA 5 MCG PEN) 5 MCG/0.02ML SOLN Inject 0.02 mLs (5 mcg total) into the skin 2 (two) times daily with a meal.  1.2 mL  11  . glipiZIDE (GLUCOTROL XL) 10 MG 24 hr tablet Take 1 tablet (10 mg total) by mouth daily.  30 tablet  11  . Insulin Pen Needle (B-D UF III MINI PEN NEEDLES) 31G X 5 MM MISC Use as directed with Byetta  Pen  100 each  11  . lisinopril (PRINIVIL,ZESTRIL) 5 MG tablet Take 1 tablet (5 mg total) by mouth daily.  30 tablet  11  . rosuvastatin (CRESTOR) 10 MG tablet Take 1 tablet (10 mg total) by mouth daily.  30 tablet  11      Review of Systems Review of Systems  Constitutional: Negative for fever, appetite change, fatigue and unexpected weight change.  Eyes: Negative for pain and visual disturbance.  Respiratory: Negative for cough and shortness of breath.   Cardiovascular: Negative for cp or palpitations    Gastrointestinal: Negative for nausea, diarrhea and constipation.  Genitourinary: Negative for urgency and frequency.  Skin: Negative for pallor or rash  pos for redness/ swelling of r lower leg with varicose veins  Neurological: Negative for weakness, light-headedness, numbness and headaches.  Hematological: Negative for adenopathy. Does not bruise/bleed easily.  Psychiatric/Behavioral: Negative for dysphoric mood. The patient is not nervous/anxious.         Objective:   Physical Exam  Constitutional: She appears well-developed and well-nourished. No distress.       Morbidly obese and well appearing   Neck: Normal  range of motion. Neck supple. No JVD present.  Cardiovascular: Normal rate and regular rhythm.   Pulmonary/Chest: Effort normal and breath sounds normal.  Musculoskeletal: She exhibits edema and tenderness.       R LE- area of firmness/ swelling/ erythema and heat is smaller and less tender on medial calf No open skin Varicosities present  Lymphadenopathy:    She has no cervical adenopathy.  Neurological: She is alert. She has normal reflexes. She exhibits normal muscle tone.  Skin: Skin is warm and dry. No rash noted. There is erythema. No pallor.  Psychiatric: She has a normal mood and affect.          Assessment & Plan:

## 2011-08-23 ENCOUNTER — Ambulatory Visit (INDEPENDENT_AMBULATORY_CARE_PROVIDER_SITE_OTHER): Payer: BC Managed Care – PPO | Admitting: Family Medicine

## 2011-08-23 ENCOUNTER — Encounter: Payer: Self-pay | Admitting: Family Medicine

## 2011-08-23 VITALS — BP 114/68 | HR 64 | Temp 98.0°F | Wt 321.5 lb

## 2011-08-23 DIAGNOSIS — E119 Type 2 diabetes mellitus without complications: Secondary | ICD-10-CM

## 2011-08-23 DIAGNOSIS — I8290 Acute embolism and thrombosis of unspecified vein: Secondary | ICD-10-CM

## 2011-08-23 DIAGNOSIS — I749 Embolism and thrombosis of unspecified artery: Secondary | ICD-10-CM

## 2011-08-23 NOTE — Patient Instructions (Signed)
Keep working on [employer] and exercise and weight loss  Wear support hose when you are ready Elevate feet when you are sitting  Schedule lab and follow up in mid to late July

## 2011-08-23 NOTE — Assessment & Plan Note (Signed)
DM per pt is imp with byetta Enc further diet/ exercise/wt loss  F/u July with lab and visit

## 2011-08-23 NOTE — Progress Notes (Signed)
Subjective:    Patient ID: Sara Glover, female    DOB: 1955-05-22, 56 y.o.   MRN: [id number]  HPI Here for f/u of phlebitis and superficial vein clot in R lower leg Was tx with augmentin and put on asa Is morbidly obese but lost 3 lb   Leg is much better- no longer hurts Still just a little firm and swollen   Has support hose but has not tried them yet- does not want to wear them in the summer  No other problems  Is elevating feet when sitting (when she can)  Is more active/eating less sugar    Lab Results  Component Value Date   HGBA1C 10.1* 06/14/2011   sugars at home have been good  Is on byetta - doing fair with that  Sugars are improved  Does need to continue working on wt loss  Patient Active Problem List  Diagnoses  . HYPERLIPIDEMIA  . DIABETES MELLITUS, TYPE II  . Obesity  . Cerumen impaction  . Leg pain  . Leg swelling  . Blood clot in vein   Past Medical History  Diagnosis Date  . Diabetes mellitus 2004    type II  . Hyperlipidemia 2004  . Hypertension 2004  . Morbid obesity    Past Surgical History  Procedure Date  . Cesarean section    History  Substance Use Topics  . Smoking status: Never Smoker   . Smokeless tobacco: Never Used  . Alcohol Use: No   Family History  Problem Relation Age of Onset  . Diabetes Mother   . Cancer Mother     colon CA  . Schizophrenia Mother   . Diabetes Father   . Cancer Sister     ovarian CA in 2s  . Diabetes Brother    Allergies  Allergen Reactions  . Metformin     REACTION: diarrhea   Current Outpatient Prescriptions on File Prior to Visit  Medication Sig Dispense Refill  . ACCU-CHEK COMPACT TEST DRUM test strip USE AS DIRECTED  102 each  11  . exenatide (BYETTA 5 MCG PEN) 5 MCG/0.02ML SOLN Inject 0.02 mLs (5 mcg total) into the skin 2 (two) times daily with a meal.  1.2 mL  11  . glipiZIDE (GLUCOTROL XL) 10 MG 24 hr tablet Take 1 tablet (10 mg total) by mouth daily.  30 tablet  11  . Insulin Pen  Needle (B-D UF III MINI PEN NEEDLES) 31G X 5 MM MISC Use as directed with Byetta Pen  100 each  11  . lisinopril (PRINIVIL,ZESTRIL) 5 MG tablet Take 1 tablet (5 mg total) by mouth daily.  30 tablet  11  . rosuvastatin (CRESTOR) 10 MG tablet Take 1 tablet (10 mg total) by mouth daily.  30 tablet  11       Review of Systems Review of Systems  Constitutional: Negative for fever, appetite change, fatigue and unexpected weight change.  Eyes: Negative for pain and visual disturbance.  Respiratory: Negative for cough and shortness of breath.   Cardiovascular: Negative for cp or palpitations    Gastrointestinal: Negative for nausea, diarrhea and constipation.  Genitourinary: Negative for urgency and frequency. no excessive thirst  Skin: Negative for pallor or rash   Neurological: Negative for weakness, light-headedness, numbness and headaches.  Hematological: Negative for adenopathy. Does not bruise/bleed easily.  Psychiatric/Behavioral: Negative for dysphoric mood. The patient is not nervous/anxious.         Objective:   Physical Exam  Constitutional:  She appears well-developed and well-nourished. No distress.       Morbidly obese and well appearing   HENT:  Head: Normocephalic and atraumatic.  Mouth/Throat: Oropharynx is clear and moist.  Eyes: Conjunctivae and EOM are normal. Pupils are equal, round, and reactive to light. No scleral icterus.  Neck: Normal range of motion. Neck supple. No JVD present. Carotid bruit is not present. No thyromegaly present.  Cardiovascular: Normal rate, regular rhythm and normal heart sounds.   Pulmonary/Chest: Effort normal and breath sounds normal. No respiratory distress. She has no wheezes.  Musculoskeletal: She exhibits edema. She exhibits no tenderness.       Site of superficial phlebitis/ clot on R leg is much improved Less indurated and tender Area is smaller  Some baseline   Lymphadenopathy:    She has no cervical adenopathy.  Neurological:  She is alert. She has normal reflexes. No cranial nerve deficit. She exhibits normal muscle tone. Coordination normal.  Skin: Skin is warm and dry. No rash noted. No erythema. No pallor.  Psychiatric: She has a normal mood and affect.          Assessment & Plan:

## 2011-08-23 NOTE — Assessment & Plan Note (Signed)
Superficial RLE clot-much improved Urged to wear supp hose (pt declines in summer) and also elevate legs/ will continue asa  Wt loss important also  Will continue to follow

## 2011-09-02 ENCOUNTER — Other Ambulatory Visit: Payer: Self-pay | Admitting: Family Medicine

## 2011-10-19 ENCOUNTER — Other Ambulatory Visit (INDEPENDENT_AMBULATORY_CARE_PROVIDER_SITE_OTHER): Payer: BC Managed Care – PPO

## 2011-10-19 DIAGNOSIS — E119 Type 2 diabetes mellitus without complications: Secondary | ICD-10-CM

## 2011-10-19 LAB — RENAL FUNCTION PANEL
Albumin: 3.7 g/dL (ref 3.5–5.2)
Calcium: 9.3 mg/dL (ref 8.4–10.5)
Chloride: 104 mEq/L (ref 96–112)
Potassium: 5 mEq/L (ref 3.5–5.1)

## 2011-10-26 ENCOUNTER — Encounter: Payer: Self-pay | Admitting: Family Medicine

## 2011-10-26 ENCOUNTER — Ambulatory Visit (INDEPENDENT_AMBULATORY_CARE_PROVIDER_SITE_OTHER): Payer: BC Managed Care – PPO | Admitting: Family Medicine

## 2011-10-26 VITALS — BP 122/72 | HR 83 | Temp 97.5°F | Wt 319.8 lb

## 2011-10-26 DIAGNOSIS — E669 Obesity, unspecified: Secondary | ICD-10-CM

## 2011-10-26 DIAGNOSIS — E119 Type 2 diabetes mellitus without complications: Secondary | ICD-10-CM

## 2011-10-26 NOTE — Patient Instructions (Addendum)
Call [place] to find out about next info session for weight loss surgery  Consider support stockings for your varicose veins  Diabetes is much improved -  You need to stick to the diabetic diet 100% now  Continue current medicines  Follow up in 3 months with labs prior

## 2011-10-26 NOTE — Assessment & Plan Note (Signed)
DM is much improved  Pt will schedule eye exam  Wants to stay at byetta 5 and work harder on diet  Is thinking about bariatric surgery as well  F/u 3 mo  Pt also needs to schedule health mt

## 2011-10-26 NOTE — Progress Notes (Signed)
Subjective:    Patient ID: Sara Glover, female    DOB: 05-Jun-1955, 56 y.o.   MRN: [id number]  HPI Here for f/u of her DM  Leg is much better -- still a knot there  Does not wear support stocking   DM follow up Diabetes Home sugar results -- ams usually 150-170, and later in the day (does not check as often)- perhaps 180s  DM diet - (has lost 3 lb)- coming along much better - sticks to DM diet 60%of the time   Exercise -swims - when weather permits, walks a little  Symptoms A1C last  Lab Results  Component Value Date   HGBA1C 7.6* 10/19/2011   down from over 10  No problems with medications  Renal protection Last eye exam - last year - is due for one   She may be interested in bariatric surgery in the past  Has lost 40lb in 5 year-- is very very slow   Patient Active Problem List  Diagnosis  . HYPERLIPIDEMIA  . DIABETES MELLITUS, TYPE II  . Obesity  . Cerumen impaction  . Leg pain  . Leg swelling  . Blood clot in vein   Past Medical History  Diagnosis Date  . Diabetes mellitus 2004    type II  . Hyperlipidemia 2004  . Hypertension 2004  . Morbid obesity    Past Surgical History  Procedure Date  . Cesarean section    History  Substance Use Topics  . Smoking status: Never Smoker   . Smokeless tobacco: Never Used  . Alcohol Use: No   Family History  Problem Relation Age of Onset  . Diabetes Mother   . Cancer Mother     colon CA  . Schizophrenia Mother   . Diabetes Father   . Cancer Sister     ovarian CA in 32s  . Diabetes Brother    Allergies  Allergen Reactions  . Metformin     REACTION: diarrhea   Current Outpatient Prescriptions on File Prior to Visit  Medication Sig Dispense Refill  . ACCU-CHEK COMPACT TEST DRUM test strip USE AS DIRECTED  102 each  11  . exenatide (BYETTA 5 MCG PEN) 5 MCG/0.02ML SOLN Inject 0.02 mLs (5 mcg total) into the skin 2 (two) times daily with a meal.  1.2 mL  11  . glipiZIDE (GLUCOTROL XL) 10 MG 24 hr tablet Take 1  tablet (10 mg total) by mouth daily.  30 tablet  11  . Insulin Pen Needle (B-D UF III MINI PEN NEEDLES) 31G X 5 MM MISC Use as directed with Byetta Pen  100 each  11  . lisinopril (PRINIVIL,ZESTRIL) 5 MG tablet Take 1 tablet (5 mg total) by mouth daily.  30 tablet  11  . rosuvastatin (CRESTOR) 10 MG tablet Take 1 tablet (10 mg total) by mouth daily.  30 tablet  11  . aspirin 81 MG tablet Take 81 mg by mouth daily.            Review of Systems Review of Systems  Constitutional: Negative for fever, appetite change, fatigue and unexpected weight change.  Eyes: Negative for pain and visual disturbance.  Respiratory: Negative for cough and shortness of breath.   Cardiovascular: Negative for cp or palpitations    Gastrointestinal: Negative for nausea, diarrhea and constipation.  Genitourinary: Negative for urgency and frequency.  Skin: Negative for pallor or rash   MSK pos for aches and pains from OA and obesity  Neurological: Negative  for weakness, light-headedness, numbness and headaches.  Hematological: Negative for adenopathy. Does not bruise/bleed easily.  Psychiatric/Behavioral: Negative for dysphoric mood. The patient is not nervous/anxious.         Objective:   Physical Exam  Constitutional: She appears well-developed and well-nourished. No distress.       obese and well appearing   HENT:  Head: Normocephalic and atraumatic.  Mouth/Throat: Oropharynx is clear and moist.  Eyes: Conjunctivae and EOM are normal. Pupils are equal, round, and reactive to light. No scleral icterus.  Neck: Normal range of motion. Neck supple. No JVD present. Carotid bruit is not present. No thyromegaly present.  Cardiovascular: Normal rate, regular rhythm, normal heart sounds and intact distal pulses.  Exam reveals no gallop.   Pulmonary/Chest: Effort normal and breath sounds normal. No respiratory distress. She has no wheezes.  Abdominal: Soft. Bowel sounds are normal. She exhibits no distension, no  abdominal bruit and no mass. There is no tenderness.  Musculoskeletal: She exhibits edema. She exhibits no tenderness.       Baseline pedal edema and varicosities Knot on R leg has come down in size and redness is much improved   Lymphadenopathy:    She has no cervical adenopathy.  Neurological: She is alert. She has normal reflexes. No cranial nerve deficit. She exhibits normal muscle tone. Coordination normal.  Skin: Skin is warm and dry. No rash noted. No erythema. No pallor.  Psychiatric: She has a normal mood and affect.          Assessment & Plan:

## 2011-10-31 NOTE — Assessment & Plan Note (Signed)
bmi is 48- pt able to loose some wt on her own but very slowly- she is frustrated Multiple co morbid conditions She is considering bariatric sx- disc need for certain qualifications Pt will again attend the seminar at [facility] and inquire about ref if necessary  I do think wt loss is crucial to better health and quality of life

## 2011-12-20 ENCOUNTER — Encounter: Payer: Self-pay | Admitting: Family Medicine

## 2011-12-20 ENCOUNTER — Ambulatory Visit (INDEPENDENT_AMBULATORY_CARE_PROVIDER_SITE_OTHER): Payer: BC Managed Care – PPO | Admitting: Family Medicine

## 2011-12-20 VITALS — BP 130/64 | HR 78 | Temp 97.9°F | Ht 66.75 in | Wt 321.2 lb

## 2011-12-20 DIAGNOSIS — E669 Obesity, unspecified: Secondary | ICD-10-CM

## 2011-12-20 DIAGNOSIS — Z23 Encounter for immunization: Secondary | ICD-10-CM

## 2011-12-20 NOTE — Progress Notes (Signed)
Subjective:    Patient ID: Sara Glover, female    DOB: December 29, 1955, 56 y.o.   MRN: [id number]  HPI Here to disc bariatric surgery  Will be having surgery at [facility] in [place] (due to hospital)  Dr Alva Garnet will be doing her surgery  Will be having roux and Y bypass Needs notes from march  Bringing form with her - for 6 months of weights  Maybe not , depending on what we get from other notes   Pt's wt is up 3 lb today with bmi of 50.7 Highest wt here was 338 in 9/10- so has lost some on her own  Absolute top weight was 368  Pt is not an emotional eater  No longer snacking at night  Stopped drinking diet drinks and now more water Walking more   Be her scale she is 318  She does not count calories currently Has not met with nutritionist yet  Is cutting her sugar and also cutting her fats   Is exercising - just started some walking  Will investigate water exercise in [place] also   Will do labs and upper GI on wed    Fax notes to [phone]  Patient Active Problem List  Diagnosis  . HYPERLIPIDEMIA  . DIABETES MELLITUS, TYPE II  . Obesity  . Cerumen impaction  . Blood clot in vein   Past Medical History  Diagnosis Date  . Diabetes mellitus 2004    type II  . Hyperlipidemia 2004  . Hypertension 2004  . Morbid obesity    Past Surgical History  Procedure Date  . Cesarean section    History  Substance Use Topics  . Smoking status: Never Smoker   . Smokeless tobacco: Never Used  . Alcohol Use: No   Family History  Problem Relation Age of Onset  . Diabetes Mother   . Cancer Mother     colon CA  . Schizophrenia Mother   . Diabetes Father   . Cancer Sister     ovarian CA in 27s  . Diabetes Brother    Allergies  Allergen Reactions  . Metformin     REACTION: diarrhea   Current Outpatient Prescriptions on File Prior to Visit  Medication Sig Dispense Refill  . ACCU-CHEK COMPACT TEST DRUM test strip USE AS DIRECTED  102 each  11  . exenatide (BYETTA 5  MCG PEN) 5 MCG/0.02ML SOLN Inject 0.02 mLs (5 mcg total) into the skin 2 (two) times daily with a meal.  1.2 mL  11  . glipiZIDE (GLUCOTROL XL) 10 MG 24 hr tablet Take 1 tablet (10 mg total) by mouth daily.  30 tablet  11  . Insulin Pen Needle (B-D UF III MINI PEN NEEDLES) 31G X 5 MM MISC Use as directed with Byetta Pen  100 each  11  . lisinopril (PRINIVIL,ZESTRIL) 5 MG tablet Take 1 tablet (5 mg total) by mouth daily.  30 tablet  11  . rosuvastatin (CRESTOR) 10 MG tablet Take 1 tablet (10 mg total) by mouth daily.  30 tablet  11  . aspirin 81 MG tablet Take 81 mg by mouth daily.             Review of Systems Review of Systems  Constitutional: Negative for fever, appetite change,  and unexpected weight change. pos for fatigue Eyes: Negative for pain and visual disturbance.  Respiratory: Negative for cough and pos for sob on exertion from obesity Cardiovascular: Negative for cp or palpitations  Gastrointestinal: Negative for nausea, diarrhea and constipation.  Genitourinary: Negative for urgency and frequency.  Skin: Negative for pallor or rash   MSk pos for knee pain with walking- related to obesity Neurological: Negative for weakness, light-headedness, numbness and headaches.  Hematological: Negative for adenopathy. Does not bruise/bleed easily.  Psychiatric/Behavioral: Negative for dysphoric mood. The patient is not nervous/anxious.         Objective:   Physical Exam  Constitutional: She appears well-developed and well-nourished. No distress.       Morbidly obese and well appearing   HENT:  Head: Normocephalic and atraumatic.  Mouth/Throat: Oropharynx is clear and moist.  Eyes: Conjunctivae normal and EOM are normal. Pupils are equal, round, and reactive to light. No scleral icterus.  Neck: Normal range of motion. Neck supple. No JVD present. No thyromegaly present.  Cardiovascular: Normal rate, regular rhythm and normal heart sounds.   Pulmonary/Chest: Effort normal and  breath sounds normal. No respiratory distress. She has no wheezes. She has no rales.  Musculoskeletal: She exhibits edema. She exhibits no tenderness.       basline firm edema in lower legs with hyperpigmentation Also venous insufficiency  Lymphadenopathy:    She has no cervical adenopathy.  Neurological: She is alert. She has normal reflexes. No cranial nerve deficit. She exhibits normal muscle tone. Coordination normal.  Skin: Skin is warm and dry. No erythema. No pallor.  Psychiatric: She has a normal mood and affect.       occ a little tearful when disc hx of her wt gain          Assessment & Plan:

## 2011-12-20 NOTE — Assessment & Plan Note (Addendum)
Here for her first bariatric visit  Filled out form incl wt , ht, meds, bmi, diet and exercise efforts Co morbidities incl DM and lipids  Disc in detail her food issues- to disc with counselor I am unable to calc number of calories needed -she will see nutritionist Planning roux en Y procedure at [facility] in [place] Will fax office note when done  F/u 1 mo with next form She has succeeded in loosing some wt on her own already - is very motivated  >25 min spent with face to face with patient, >50% counseling and/or coordinating care   Flu shot today

## 2011-12-20 NOTE — Patient Instructions (Addendum)
Keep working on low sugar low fat diet More water Smaller portion sizes Walking or water exercise 5 d per week  See the nutritionist Follow up with me in about 1 month  Flu shot today

## 2011-12-22 ENCOUNTER — Ambulatory Visit: Payer: Self-pay | Admitting: Bariatrics

## 2011-12-22 LAB — PHOSPHORUS: Phosphorus: 3 mg/dL (ref 2.5–4.9)

## 2011-12-22 LAB — IRON AND TIBC
Iron Saturation: 22 %
Iron: 68 ug/dL (ref 50–170)

## 2011-12-22 LAB — CBC WITH DIFFERENTIAL/PLATELET
Basophil #: 0 10*3/uL (ref 0.0–0.1)
Eosinophil #: 0.1 10*3/uL (ref 0.0–0.7)
HCT: 42.7 % (ref 35.0–47.0)
HGB: 14.6 g/dL (ref 12.0–16.0)
Lymphocyte %: 25.3 %
MCH: 30.1 pg (ref 26.0–34.0)
Monocyte %: 6.4 %
Neutrophil #: 4.8 10*3/uL (ref 1.4–6.5)
Neutrophil %: 66.3 %
Platelet: 228 10*3/uL (ref 150–440)
RDW: 13.8 % (ref 11.5–14.5)
WBC: 7.3 10*3/uL (ref 3.6–11.0)

## 2011-12-22 LAB — COMPREHENSIVE METABOLIC PANEL
Albumin: 3.7 g/dL (ref 3.4–5.0)
Alkaline Phosphatase: 73 U/L (ref 50–136)
Calcium, Total: 8.7 mg/dL (ref 8.5–10.1)
Co2: 26 mmol/L (ref 21–32)
EGFR (African American): >60
Glucose: 187 mg/dL — ABNORMAL HIGH (ref 65–99)
Osmolality: 286 (ref 275–301)
Potassium: 4 mmol/L (ref 3.5–5.1)
SGOT(AST): 23 U/L (ref 15–37)
SGPT (ALT): 26 U/L (ref 12–78)

## 2011-12-22 LAB — AMYLASE: Amylase: 52 U/L (ref 25–115)

## 2011-12-22 LAB — PROTIME-INR
INR: 1
Prothrombin Time: 13.3 secs (ref 11.5–14.7)

## 2011-12-22 LAB — APTT: Activated PTT: 27 secs (ref 23.6–35.9)

## 2011-12-22 LAB — LIPASE, BLOOD: Lipase: 294 U/L (ref 73–393)

## 2011-12-22 LAB — MAGNESIUM: Magnesium: 1.8 mg/dL

## 2012-01-13 ENCOUNTER — Ambulatory Visit: Payer: Self-pay | Admitting: Bariatrics

## 2012-01-19 ENCOUNTER — Encounter: Payer: Self-pay | Admitting: Family Medicine

## 2012-01-19 ENCOUNTER — Ambulatory Visit (INDEPENDENT_AMBULATORY_CARE_PROVIDER_SITE_OTHER): Payer: BC Managed Care – PPO | Admitting: Family Medicine

## 2012-01-19 VITALS — BP 134/68 | HR 74 | Temp 98.5°F | Ht 66.75 in | Wt 318.8 lb

## 2012-01-19 DIAGNOSIS — E119 Type 2 diabetes mellitus without complications: Secondary | ICD-10-CM

## 2012-01-19 DIAGNOSIS — E669 Obesity, unspecified: Secondary | ICD-10-CM

## 2012-01-19 DIAGNOSIS — G4730 Sleep apnea, unspecified: Secondary | ICD-10-CM

## 2012-01-19 DIAGNOSIS — E785 Hyperlipidemia, unspecified: Secondary | ICD-10-CM

## 2012-01-19 LAB — COMPREHENSIVE METABOLIC PANEL
AST: 17 U/L (ref 0–37)
Alkaline Phosphatase: 54 U/L (ref 39–117)
BUN: 12 mg/dL (ref 6–23)
Calcium: 8.8 mg/dL (ref 8.4–10.5)
Creatinine, Ser: 0.8 mg/dL (ref 0.4–1.2)
Total Bilirubin: 0.6 mg/dL (ref 0.3–1.2)

## 2012-01-19 LAB — LIPID PANEL
Cholesterol: 125 mg/dL (ref 0–200)
HDL: 40.8 mg/dL (ref >39.00)
LDL Cholesterol: 59 mg/dL (ref 0–99)
Triglycerides: 125 mg/dL (ref 0.0–149.0)
VLDL: 25 mg/dL (ref 0.0–40.0)

## 2012-01-19 NOTE — Patient Instructions (Signed)
Here is your form for the bariatric surgeon Work hard on diet and exercise Labs today Start checking some sugars 2 hours after a meal in afternoon

## 2012-01-19 NOTE — Assessment & Plan Note (Signed)
Morbid obesity Visit/ month 2 form filled out for future bariatric surgery in [facility] Disc calories - aiming for 1500 Asked her to inc exercise to 20 min 5 days per week  She has met with nutritionist and started cpap  Will f/u with her surgeon today

## 2012-01-19 NOTE — Assessment & Plan Note (Signed)
a1c today  Sugars have come down a bit with new low cal diet  Hopefully for bariatric surgery soon On ace  Nl foot exam

## 2012-01-19 NOTE — Assessment & Plan Note (Signed)
Due for check on crestor and diet  Lab today Rev low sat fat diet

## 2012-01-19 NOTE — Assessment & Plan Note (Signed)
One night on cpap went well  Will keep that up  Disc risks of sleep apnea

## 2012-01-19 NOTE — Progress Notes (Signed)
Subjective:    Patient ID: Sara Glover, female    DOB: 28-Feb-1956, 56 y.o.   MRN: [id number]  HPI Here for f/u of bariatric surgery Unsure when her roux en Y will be yet  Today wt is down 3 lb with bmi of 50    Has seen the nutritionist and that went very well  Ran through expectations ? What her calorie intake was to be -- thinks 1200-1500 Probably will get 1500 per day   She is not sure about how close she is to that  Walking for exercise Is doing much better overall   Started cpap yesterday - did sleep better / was ok with it , getting used to the mask   Will do labs today  Needs a1c Sugars are down a little  160s in the am not checking post prandial Lab Results  Component Value Date   HGBA1C 7.6* 10/19/2011     Lab Results  Component Value Date   CHOL 140 06/14/2011   HDL 43.90 06/14/2011   LDLCALC 60 06/14/2011   LDLDIRECT 87.3 02/06/2010   TRIG 179.0* 06/14/2011   CHOLHDL 3 06/14/2011    Due for chol check Is trying to avoid fats entirely  Patient Active Problem List  Diagnosis  . HYPERLIPIDEMIA  . DIABETES MELLITUS, TYPE II  . Obesity  . Cerumen impaction  . Blood clot in vein   Past Medical History  Diagnosis Date  . Diabetes mellitus 2004    type II  . Hyperlipidemia 2004  . Hypertension 2004  . Morbid obesity    Past Surgical History  Procedure Date  . Cesarean section    History  Substance Use Topics  . Smoking status: Never Smoker   . Smokeless tobacco: Never Used  . Alcohol Use: No   Family History  Problem Relation Age of Onset  . Diabetes Mother   . Cancer Mother     colon CA  . Schizophrenia Mother   . Diabetes Father   . Cancer Sister     ovarian CA in 44s  . Diabetes Brother    Allergies  Allergen Reactions  . Metformin     REACTION: diarrhea   Current Outpatient Prescriptions on File Prior to Visit  Medication Sig Dispense Refill  . ACCU-CHEK COMPACT TEST DRUM test strip USE AS DIRECTED  102 each  11  . exenatide  (BYETTA 5 MCG PEN) 5 MCG/0.02ML SOLN Inject 0.02 mLs (5 mcg total) into the skin 2 (two) times daily with a meal.  1.2 mL  11  . glipiZIDE (GLUCOTROL XL) 10 MG 24 hr tablet Take 1 tablet (10 mg total) by mouth daily.  30 tablet  11  . Insulin Pen Needle (B-D UF III MINI PEN NEEDLES) 31G X 5 MM MISC Use as directed with Byetta Pen  100 each  11  . lisinopril (PRINIVIL,ZESTRIL) 5 MG tablet Take 1 tablet (5 mg total) by mouth daily.  30 tablet  11  . rosuvastatin (CRESTOR) 10 MG tablet Take 1 tablet (10 mg total) by mouth daily.  30 tablet  11      Review of Systems Review of Systems  Constitutional: Negative for fever, appetite change, fatigue and unexpected weight change.  Eyes: Negative for pain and visual disturbance.  Respiratory: Negative for cough and shortness of breath.   Cardiovascular: Negative for cp or palpitations    Gastrointestinal: Negative for nausea, diarrhea and constipation.  Genitourinary: Negative for urgency and frequency.  Skin: Negative  for pallor or rash   Neurological: Negative for weakness, light-headedness, numbness and headaches.  Hematological: Negative for adenopathy. Does not bruise/bleed easily.  Psychiatric/Behavioral: Negative for dysphoric mood. The patient is not nervous/anxious.         Objective:   Physical Exam  Constitutional: She appears well-developed and well-nourished. No distress.  HENT:  Head: Normocephalic and atraumatic.  Mouth/Throat: Oropharynx is clear and moist.  Eyes: Conjunctivae normal and EOM are normal. Pupils are equal, round, and reactive to light. Right eye exhibits no discharge. Left eye exhibits no discharge. No scleral icterus.  Neck: Normal range of motion. Neck supple. No JVD present. Carotid bruit is not present. No thyromegaly present.  Cardiovascular: Normal rate, regular rhythm, normal heart sounds and intact distal pulses.  Exam reveals no gallop.   Pulmonary/Chest: Effort normal and breath sounds normal. No  respiratory distress. She has no wheezes.  Abdominal: Soft. Bowel sounds are normal. She exhibits no distension, no abdominal bruit and no mass. There is no tenderness.  Musculoskeletal: She exhibits no edema.  Lymphadenopathy:    She has no cervical adenopathy.  Neurological: She is alert. She has normal reflexes. No cranial nerve deficit. She exhibits normal muscle tone. Coordination normal.  Skin: Skin is warm and dry. No rash noted. No erythema. No pallor.  Psychiatric: She has a normal mood and affect.          Assessment & Plan:

## 2012-01-20 ENCOUNTER — Other Ambulatory Visit: Payer: BC Managed Care – PPO

## 2012-01-21 ENCOUNTER — Ambulatory Visit: Payer: Self-pay | Admitting: Bariatrics

## 2012-01-25 ENCOUNTER — Ambulatory Visit: Payer: BC Managed Care – PPO | Admitting: Family Medicine

## 2012-03-01 ENCOUNTER — Ambulatory Visit: Payer: Self-pay | Admitting: Bariatrics

## 2012-03-09 ENCOUNTER — Telehealth: Payer: Self-pay | Admitting: Family Medicine

## 2012-03-09 DIAGNOSIS — E119 Type 2 diabetes mellitus without complications: Secondary | ICD-10-CM

## 2012-03-09 DIAGNOSIS — Z0000 Encounter for general adult medical examination without abnormal findings: Secondary | ICD-10-CM

## 2012-03-09 DIAGNOSIS — E785 Hyperlipidemia, unspecified: Secondary | ICD-10-CM

## 2012-03-09 NOTE — Telephone Encounter (Signed)
Message copied by Judy Pimple on Thu Mar 09, 2012 10:22 PM ------      Message from: Baldomero Lamy      Created: Tue Mar 07, 2012 12:00 PM      Regarding: Cpx labs Fri 12/20       Please order  future cpx labs for pt's upcoming lab appt.      Thanks      Rodney Booze

## 2012-03-10 ENCOUNTER — Other Ambulatory Visit: Payer: BC Managed Care – PPO

## 2012-03-14 ENCOUNTER — Other Ambulatory Visit (INDEPENDENT_AMBULATORY_CARE_PROVIDER_SITE_OTHER): Payer: BC Managed Care – PPO

## 2012-03-14 DIAGNOSIS — E785 Hyperlipidemia, unspecified: Secondary | ICD-10-CM

## 2012-03-14 DIAGNOSIS — E119 Type 2 diabetes mellitus without complications: Secondary | ICD-10-CM

## 2012-03-14 DIAGNOSIS — Z0000 Encounter for general adult medical examination without abnormal findings: Secondary | ICD-10-CM

## 2012-03-14 LAB — CBC WITH DIFFERENTIAL/PLATELET
Eosinophils Absolute: 0.2 10*3/uL (ref 0.0–0.7)
Eosinophils Relative: 3.1 % (ref 0.0–5.0)
HCT: 40.5 % (ref 36.0–46.0)
Lymphs Abs: 1.6 10*3/uL (ref 0.7–4.0)
MCHC: 33.5 g/dL (ref 30.0–36.0)
MCV: 89 fl (ref 78.0–100.0)
Monocytes Absolute: 0.4 10*3/uL (ref 0.1–1.0)
Platelets: 210 10*3/uL (ref 150.0–400.0)
WBC: 4.9 10*3/uL (ref 4.5–10.5)

## 2012-03-14 LAB — COMPREHENSIVE METABOLIC PANEL
Alkaline Phosphatase: 55 U/L (ref 39–117)
BUN: 13 mg/dL (ref 6–23)
GFR: 84.33 mL/min (ref >60.00)
Glucose, Bld: 152 mg/dL — ABNORMAL HIGH (ref 70–99)
Sodium: 138 mEq/L (ref 135–145)
Total Bilirubin: 0.7 mg/dL (ref 0.3–1.2)
Total Protein: 6.7 g/dL (ref 6.0–8.3)

## 2012-03-14 LAB — LIPID PANEL
LDL Cholesterol: 79 mg/dL (ref 0–99)
Total CHOL/HDL Ratio: 4
Triglycerides: 169 mg/dL — ABNORMAL HIGH (ref 0.0–149.0)

## 2012-03-14 LAB — TSH: TSH: 1.66 u[IU]/mL (ref 0.35–5.50)

## 2012-03-17 ENCOUNTER — Encounter: Payer: Self-pay | Admitting: Family Medicine

## 2012-03-17 ENCOUNTER — Ambulatory Visit (INDEPENDENT_AMBULATORY_CARE_PROVIDER_SITE_OTHER): Payer: BC Managed Care – PPO | Admitting: Family Medicine

## 2012-03-17 ENCOUNTER — Other Ambulatory Visit (HOSPITAL_COMMUNITY)
Admission: RE | Admit: 2012-03-17 | Discharge: 2012-03-17 | Disposition: A | Discharge status: Home / Self Care | Payer: BC Managed Care – PPO | Source: Ambulatory Visit | Attending: Family Medicine | Admitting: Family Medicine

## 2012-03-17 VITALS — BP 126/76 | HR 76 | Temp 98.4°F | Ht 66.75 in | Wt 293.8 lb

## 2012-03-17 DIAGNOSIS — Z01419 Encounter for gynecological examination (general) (routine) without abnormal findings: Secondary | ICD-10-CM

## 2012-03-17 DIAGNOSIS — E785 Hyperlipidemia, unspecified: Secondary | ICD-10-CM

## 2012-03-17 DIAGNOSIS — E669 Obesity, unspecified: Secondary | ICD-10-CM

## 2012-03-17 DIAGNOSIS — E119 Type 2 diabetes mellitus without complications: Secondary | ICD-10-CM

## 2012-03-17 DIAGNOSIS — Z0000 Encounter for general adult medical examination without abnormal findings: Secondary | ICD-10-CM

## 2012-03-17 DIAGNOSIS — Z1151 Encounter for screening for human papillomavirus (HPV): Secondary | ICD-10-CM | POA: Insufficient documentation

## 2012-03-17 NOTE — Progress Notes (Signed)
Subjective:    Patient ID: Sara Glover, female    DOB: 03-Jan-1956, 56 y.o.   MRN: [id number]  HPI Here for health maintenance exam and to review chronic medical problems    Had her bariatric surgery on 11/30 All went well - went home right away  Is back to work - and feels really good  By her scales so far - she has lost 20 lb since Monday - and she can really feel it   Started a bit of exercise - just joined a gym and is very excited about that  Walks outside also  Has a treadmill as well   bp is stable today  No cp or palpitations or headaches or edema  No longer on any medicine at all  BP Readings from Last 3 Encounters:  03/17/12 126/76  01/19/12 134/68  12/20/11 130/64     Blood sugars have been much better 106- 150  Had insulin for a while   Lost 25 lb so far here   Lab Results  Component Value Date   HGBA1C 6.9* 03/14/2012     mammo 9/10 Has not had one since then  Wants to go to [facility]   Last pap 9/10 Still wants to get her pap today   Has never had a colonoscopy  Mother had a colonoscopy  Wants to do it next year - will call   Sister had ovarian cancer  No pain or bloating   Lab Results  Component Value Date   CHOL 148 03/14/2012   CHOL 125 01/19/2012   CHOL 140 06/14/2011   Lab Results  Component Value Date   HDL 34.90* 03/14/2012   HDL 40.80 01/19/2012   HDL 43.90 06/14/2011   Lab Results  Component Value Date   LDLCALC 79 03/14/2012   LDLCALC 59 01/19/2012   LDLCALC 60 06/14/2011   Lab Results  Component Value Date   TRIG 169.0* 03/14/2012   TRIG 125.0 01/19/2012   TRIG 179.0* 06/14/2011   Lab Results  Component Value Date   CHOLHDL 4 03/14/2012   CHOLHDL 3 01/19/2012   CHOLHDL 3 06/14/2011   Lab Results  Component Value Date   LDLDIRECT 87.3 02/06/2010   LDLDIRECT 96.7 11/20/2008     Mood is very good -no depression, is very motivated   Patient Active Problem List  Diagnosis  . HYPERLIPIDEMIA  . DIABETES MELLITUS, TYPE II    . Obesity  . Cerumen impaction  . Blood clot in vein  . Sleep apnea  . Routine general medical examination at a health care facility  . Routine gynecological examination   Past Medical History  Diagnosis Date  . Diabetes mellitus 2004    type II  . Hyperlipidemia 2004  . Hypertension 2004  . Morbid obesity   . Sleep apnea    Past Surgical History  Procedure Date  . Cesarean section    History  Substance Use Topics  . Smoking status: Never Smoker   . Smokeless tobacco: Never Used  . Alcohol Use: No   Family History  Problem Relation Age of Onset  . Diabetes Mother   . Cancer Mother     colon CA  . Schizophrenia Mother   . Diabetes Father   . Cancer Sister     ovarian CA in 47s  . Diabetes Brother    Allergies  Allergen Reactions  . Metformin     REACTION: diarrhea   Current Outpatient Prescriptions on File Prior to Visit  Medication Sig Dispense Refill  . ACCU-CHEK COMPACT TEST DRUM test strip USE AS DIRECTED  102 each  11  . Insulin Pen Needle (B-D UF III MINI PEN NEEDLES) 31G X 5 MM MISC Use as directed with Byetta Pen  100 each  11  . NON FORMULARY Sleeps with C-pap          Review of Systems Review of Systems  Constitutional: Negative for fever, appetite change, fatigue and unexpected weight change.  Eyes: Negative for pain and visual disturbance.  Respiratory: Negative for cough and shortness of breath.   Cardiovascular: Negative for cp or palpitations    Gastrointestinal: Negative for nausea, diarrhea and constipation.  Genitourinary: Negative for urgency and frequency.  Skin: Negative for pallor or rash   Neurological: Negative for weakness, light-headedness, numbness and headaches.  Hematological: Negative for adenopathy. Does not bruise/bleed easily.  Psychiatric/Behavioral: Negative for dysphoric mood. The patient is not nervous/anxious.         Objective:   Physical Exam  Constitutional: She appears well-developed and well-nourished. No  distress.       Obese but with recent wt loss noted   HENT:  Head: Normocephalic and atraumatic.  Right Ear: External ear normal.  Left Ear: External ear normal.  Nose: Nose normal.  Mouth/Throat: Oropharynx is clear and moist. No oropharyngeal exudate.  Eyes: Conjunctivae normal and EOM are normal. Pupils are equal, round, and reactive to light. Right eye exhibits no discharge. Left eye exhibits no discharge. No scleral icterus.  Neck: Normal range of motion. Neck supple. No JVD present. Carotid bruit is not present. No thyromegaly present.  Cardiovascular: Normal rate, regular rhythm, normal heart sounds and intact distal pulses.  Exam reveals no gallop.   Pulmonary/Chest: Breath sounds normal. No respiratory distress. She has no wheezes.  Abdominal: Soft. Bowel sounds are normal. She exhibits no distension, no abdominal bruit and no mass. There is no tenderness.  Genitourinary: Vagina normal and uterus normal. No breast swelling, tenderness, discharge or bleeding. There is no rash, tenderness or lesion on the right labia. There is no rash or lesion on the left labia. Uterus is not enlarged and not tender. Cervix exhibits no motion tenderness, no discharge and no friability. Right adnexum displays no mass, no tenderness and no fullness. Left adnexum displays no mass, no tenderness and no fullness. No vaginal discharge found.       No masses noted  Breast exam: No mass, nodules, thickening, tenderness, bulging, retraction, inflamation, nipple discharge or skin changes noted.  No axillary or clavicular LA.  Chaperoned exam.    Musculoskeletal: She exhibits edema. She exhibits no tenderness.       LE edema is improved   Lymphadenopathy:    She has no cervical adenopathy.  Neurological: She is alert. She has normal reflexes. No cranial nerve deficit. She exhibits normal muscle tone. Coordination normal.  Skin: Skin is dry. No rash noted. No erythema. No pallor.       Erythema on lower legs is  improved   Psychiatric: She has a normal mood and affect.          Assessment & Plan:

## 2012-03-17 NOTE — Patient Instructions (Addendum)
Pap today A1c was 6.9 this check- coming down nicely Blood pressure is good You are doing great with the weight loss Keep up the good work and follow up with me in about 3 months  You need to schedule your colonoscopy so call back when you are ready to do that

## 2012-03-19 ENCOUNTER — Encounter: Payer: Self-pay | Admitting: Family Medicine

## 2012-03-19 NOTE — Assessment & Plan Note (Signed)
Improving rapidly s/p bariatric surgery Pt is doing well - will continue to improve  Enc to begin exercise program when she is ready

## 2012-03-19 NOTE — Assessment & Plan Note (Signed)
3 year exam and pap done -no complaints

## 2012-03-19 NOTE — Assessment & Plan Note (Signed)
Reviewed health habits including diet and exercise and skin cancer prevention Also reviewed health mt list, fam hx and immunizations  Wellness labs rev

## 2012-03-19 NOTE — Assessment & Plan Note (Signed)
Lab Results  Component Value Date   HGBA1C 6.9* 03/14/2012    Is improving rapidly with wt loss and diet change s/p bariatric surgery  Starting with exercise No medicines Will continue to monitor

## 2012-03-19 NOTE — Assessment & Plan Note (Signed)
Lab Results  Component Value Date   CHOL 148 03/14/2012   HDL 34.90* 03/14/2012   LDLCALC 79 03/14/2012   LDLDIRECT 87.3 02/06/2010   TRIG 169.0* 03/14/2012   CHOLHDL 4 03/14/2012   well controlled and imp with diet s/p bariatric surgery

## 2012-03-22 ENCOUNTER — Ambulatory Visit: Payer: Self-pay | Admitting: Bariatrics

## 2012-03-23 ENCOUNTER — Encounter: Payer: Self-pay | Admitting: *Deleted

## 2012-06-16 ENCOUNTER — Encounter: Payer: Self-pay | Admitting: Family Medicine

## 2012-06-16 ENCOUNTER — Ambulatory Visit (INDEPENDENT_AMBULATORY_CARE_PROVIDER_SITE_OTHER): Payer: BC Managed Care – PPO | Admitting: Family Medicine

## 2012-06-16 VITALS — BP 118/64 | HR 75 | Temp 98.7°F | Ht 66.75 in | Wt 263.8 lb

## 2012-06-16 DIAGNOSIS — E669 Obesity, unspecified: Secondary | ICD-10-CM

## 2012-06-16 DIAGNOSIS — E119 Type 2 diabetes mellitus without complications: Secondary | ICD-10-CM

## 2012-06-16 LAB — HEMOGLOBIN A1C: Hgb A1c MFr Bld: 6.1 % (ref 4.6–6.5)

## 2012-06-16 NOTE — Progress Notes (Signed)
Subjective:    Patient ID: Sara Glover, female    DOB: 1955-03-28, 57 y.o.   MRN: [id number]  HPI Here for f/u of DM   Pt has had 30 lb further wt loss since last visit Since her bariatric surgery has lost over 50 lb   Is feeling great   No DM med currently  Last a1c was 6.3 three mo ago  Last non fasting sugar was 126    Diet- is getting used to the dietary challenges and has to carry food with her - recently had to inc her protein to 80 grams  Exercise - is going to the gym and rides bike for 1 hour (12 miles) -- 5-6 times per week  She has a pool- can't wait to swim   Patient Active Problem List  Diagnosis  . HYPERLIPIDEMIA  . DIABETES MELLITUS, TYPE II  . Obesity  . Cerumen impaction  . Blood clot in vein  . Sleep apnea  . Routine general medical examination at a health care facility  . Routine gynecological examination   Past Medical History  Diagnosis Date  . Diabetes mellitus 2004    type II  . Hyperlipidemia 2004  . Hypertension 2004  . Morbid obesity   . Sleep apnea    Past Surgical History  Procedure Laterality Date  . Cesarean section    . Bariactric surgery    . Cholecystectomy     History  Substance Use Topics  . Smoking status: Never Smoker   . Smokeless tobacco: Never Used  . Alcohol Use: No   Family History  Problem Relation Age of Onset  . Diabetes Mother   . Cancer Mother     colon CA  . Schizophrenia Mother   . Diabetes Father   . Cancer Sister     ovarian CA in 44s  . Diabetes Brother    Allergies  Allergen Reactions  . Metformin     REACTION: diarrhea   Current Outpatient Prescriptions on File Prior to Visit  Medication Sig Dispense Refill  . Insulin Pen Needle (B-D UF III MINI PEN NEEDLES) 31G X 5 MM MISC Use as directed with Byetta Pen  100 each  11  . ACCU-CHEK COMPACT TEST DRUM test strip USE AS DIRECTED  102 each  11   No current facility-administered medications on file prior to visit.     Review of  Systems Review of Systems  Constitutional: Negative for fever, appetite change, fatigue and unexpected weight change.  Eyes: Negative for pain and visual disturbance.  Respiratory: Negative for cough and shortness of breath.   Cardiovascular: Negative for cp or palpitations    Gastrointestinal: Negative for nausea, diarrhea and constipation.  Genitourinary: Negative for urgency and frequency. no excessive thirst or urination Skin: Negative for pallor or rash   Neurological: Negative for weakness, light-headedness, numbness and headaches.  Hematological: Negative for adenopathy. Does not bruise/bleed easily.  Psychiatric/Behavioral: Negative for dysphoric mood. The patient is not nervous/anxious.         Objective:   Physical Exam  Constitutional: She appears well-developed and well-nourished. No distress.  HENT:  Head: Normocephalic and atraumatic.  Eyes: Conjunctivae and EOM are normal. Pupils are equal, round, and reactive to light.  Neck: Normal range of motion. Neck supple. Carotid bruit is not present. No thyromegaly present.  Cardiovascular: Normal rate, regular rhythm and intact distal pulses.  Exam reveals no gallop.   Pulmonary/Chest: Effort normal and breath sounds normal. No respiratory distress.  Abdominal: She exhibits no abdominal bruit.  Musculoskeletal: She exhibits no edema.  Lymphadenopathy:    She has no cervical adenopathy.  Neurological: She is alert.  Skin: Skin is warm and dry. No rash noted. No pallor.  Psychiatric: She has a normal mood and affect.          Assessment & Plan:

## 2012-06-16 NOTE — Assessment & Plan Note (Signed)
After 50 lb wt loss with bariatric surgery- expect much improvement  Last a1c was 6.3 No meds Good diet and exercise a1c today

## 2012-06-16 NOTE — Patient Instructions (Signed)
Great job with your weight loss - keep up the good owrk  Checking a1c today  Schedule PE with lab prior next January

## 2012-06-16 NOTE — Assessment & Plan Note (Signed)
Commended on great job so far - lost over 50 lb since nov when she had bariatric surgery Did inc her protein recently [place] exercise

## 2012-06-19 ENCOUNTER — Encounter: Payer: Self-pay | Admitting: *Deleted

## 2012-09-28 ENCOUNTER — Other Ambulatory Visit: Payer: Self-pay

## 2013-01-25 ENCOUNTER — Other Ambulatory Visit: Payer: Self-pay

## 2013-03-19 ENCOUNTER — Encounter: Payer: BC Managed Care – PPO | Admitting: Family Medicine

## 2013-03-21 ENCOUNTER — Encounter: Payer: BC Managed Care – PPO | Admitting: Family Medicine

## 2013-06-04 ENCOUNTER — Encounter: Payer: Self-pay | Admitting: Family Medicine

## 2013-06-04 ENCOUNTER — Ambulatory Visit (INDEPENDENT_AMBULATORY_CARE_PROVIDER_SITE_OTHER): Payer: BC Managed Care – PPO | Admitting: Family Medicine

## 2013-06-04 VITALS — BP 132/68 | HR 72 | Temp 97.9°F | Ht 66.0 in | Wt 248.2 lb

## 2013-06-04 DIAGNOSIS — Z1211 Encounter for screening for malignant neoplasm of colon: Secondary | ICD-10-CM

## 2013-06-04 DIAGNOSIS — R7309 Other abnormal glucose: Secondary | ICD-10-CM

## 2013-06-04 DIAGNOSIS — Z0000 Encounter for general adult medical examination without abnormal findings: Secondary | ICD-10-CM

## 2013-06-04 DIAGNOSIS — E669 Obesity, unspecified: Secondary | ICD-10-CM

## 2013-06-04 DIAGNOSIS — R739 Hyperglycemia, unspecified: Secondary | ICD-10-CM

## 2013-06-04 DIAGNOSIS — E559 Vitamin D deficiency, unspecified: Secondary | ICD-10-CM

## 2013-06-04 DIAGNOSIS — E785 Hyperlipidemia, unspecified: Secondary | ICD-10-CM

## 2013-06-04 NOTE — Progress Notes (Signed)
Pre visit review using our clinic review tool, if applicable. No additional management support is needed unless otherwise documented below in the visit note. 

## 2013-06-04 NOTE — Assessment & Plan Note (Signed)
Lipid panel today  Diet improved with wt loss/bariatric surgery

## 2013-06-04 NOTE — Assessment & Plan Note (Signed)
D level today  Pt has hx of bariatric surg Take 2000 iu qd  Disc importance of this to bone and overall health

## 2013-06-04 NOTE — Progress Notes (Signed)
Subjective:    Patient ID: Sara Glover, female    DOB: 07/27/1955, 58 y.o.   MRN: [id number]  HPI Here for health maintenance exam and to review chronic medical problems   She is feeling really good   Wt is down 15 lb  It is coming off slowly - total loss about 100 lb  Had bariatric surgery bmi is now 40-continues to improve Still working on it - going to the gym and eating right  She can move a whole lot more -happy about that   Colon cancer screen- has not done that  Family hx - mother had colonoscopy    Mammogram 9/10- overdue  Self exam- no lumps   Flu vaccine - she had one in the fall   Pneumovax 9/10  Pap 12/13 nl  fam hx of ovarian ca- sister  No pain or symptoms at all   Td 4/13  Eye exam - was spring of last year   Due for labs  Hyperlipidemia Lab Results  Component Value Date   CHOL 148 03/14/2012   HDL 34.90* 03/14/2012   LDLCALC 79 03/14/2012   LDLDIRECT 87.3 02/06/2010   TRIG 169.0* 03/14/2012   CHOLHDL 4 03/14/2012    No meds at all right now but vitamins  No protein supplementation  In the past protein and D were a little low and she inc her D  Hyperglycemia Lab Results  Component Value Date   HGBA1C 6.1 06/16/2012     Patient Active Problem List   Diagnosis Date Noted  . Routine gynecological examination 03/17/2012  . Routine general medical examination at a health care facility 03/09/2012  . Sleep apnea 01/19/2012  . Blood clot in vein 07/25/2011  . Obesity 06/14/2011  . HYPERLIPIDEMIA 11/20/2008  . Hyperglycemia 11/20/2008   Past Medical History  Diagnosis Date  . Diabetes mellitus 2004    type II  . Hyperlipidemia 2004  . Hypertension 2004  . Morbid obesity   . Sleep apnea    Past Surgical History  Procedure Laterality Date  . Cesarean section    . Bariactric surgery    . Cholecystectomy     History  Substance Use Topics  . Smoking status: Never Smoker   . Smokeless tobacco: Never Used  . Alcohol Use: No    Family History  Problem Relation Age of Onset  . Diabetes Mother   . Cancer Mother     colon CA  . Schizophrenia Mother   . Diabetes Father   . Cancer Sister     ovarian CA in 6840s  . Diabetes Brother    Allergies  Allergen Reactions  . Metformin     REACTION: diarrhea   No current outpatient prescriptions on file prior to visit.   No current facility-administered medications on file prior to visit.    Review of Systems Review of Systems  Constitutional: Negative for fever, appetite change, fatigue and unexpected weight change.  Eyes: Negative for pain and visual disturbance.  Respiratory: Negative for cough and shortness of breath.   Cardiovascular: Negative for cp or palpitations    Gastrointestinal: Negative for nausea, diarrhea and constipation.  Genitourinary: Negative for urgency and frequency.  Skin: Negative for pallor or rash   Neurological: Negative for weakness, light-headedness, numbness and headaches.  Hematological: Negative for adenopathy. Does not bruise/bleed easily.  Psychiatric/Behavioral: Negative for dysphoric mood. The patient is not nervous/anxious.         Objective:   Physical Exam  Constitutional: She appears well-developed and well-nourished. No distress.  obese and well appearing  Weight loss noted   HENT:  Head: Normocephalic and atraumatic.  Right Ear: External ear normal.  Left Ear: External ear normal.  Mouth/Throat: Oropharynx is clear and moist.  Eyes: Conjunctivae and EOM are normal. Pupils are equal, round, and reactive to light. No scleral icterus.  Neck: Normal range of motion. Neck supple. No JVD present. Carotid bruit is not present. No thyromegaly present.  Cardiovascular: Normal rate, regular rhythm, normal heart sounds and intact distal pulses.  Exam reveals no gallop.   Pulmonary/Chest: Effort normal and breath sounds normal. No respiratory distress. She has no wheezes. She exhibits no tenderness.  Abdominal: Soft. Bowel  sounds are normal. She exhibits no distension, no abdominal bruit and no mass. There is no tenderness.  Genitourinary: No breast swelling, tenderness, discharge or bleeding.  Breast exam: No mass, nodules, thickening, tenderness, bulging, retraction, inflamation, nipple discharge or skin changes noted.  No axillary or clavicular LA.      Musculoskeletal: Normal range of motion. She exhibits no edema and no tenderness.  Lymphadenopathy:    She has no cervical adenopathy.  Neurological: She is alert. She has normal reflexes. No cranial nerve deficit. She exhibits normal muscle tone. Coordination normal.  Skin: Skin is warm and dry. No rash noted. No erythema. No pallor.  Psychiatric: She has a normal mood and affect.          Assessment & Plan:

## 2013-06-04 NOTE — Assessment & Plan Note (Signed)
D/w patient UY:[id number]:options for colon cancer screening, including IFOB vs. colonoscopy.  Risks and benefits of both were discussed and patient voiced understanding.  Pt elects for: ifob card She will check with ins re: coverage for colonoscopy and let us know I enc her to do this strongly in light of fam hx of colon cancer

## 2013-06-04 NOTE — Assessment & Plan Note (Signed)
Discussed how this problem influences overall health and the risks it imposes  Reviewed plan for weight loss with lower calorie diet (via better food choices and also portion control or program like weight watchers) and exercise building up to or more than 30 minutes 5 days per week including some aerobic activity    She is doing great so far with slow steady wt loss after bariatric surgery Enc to continue

## 2013-06-04 NOTE — Patient Instructions (Signed)
Labs today  Call your insurance co. About colonoscopy coverage - then call us to schedule when you are ready  Also ask if they may cover a pelvic ultrasound since you have a family history of ovarian cancer  Please do the stool card  Take care of yourself - keep exercising  Don't forget to make your mammogram appt

## 2013-06-04 NOTE — Assessment & Plan Note (Signed)
A1C today  Better diet and 100 lb wt loss since bariatric surgery

## 2013-06-04 NOTE — Assessment & Plan Note (Signed)
Reviewed health habits including diet and exercise and skin cancer prevention Reviewed appropriate screening tests for age  Also reviewed health mt list, fam hx and immunization status , as well as social and family history   See HPI Lab today 

## 2013-06-05 LAB — CBC WITH DIFFERENTIAL/PLATELET
BASOS ABS: 0 10*3/uL (ref 0.0–0.1)
Basophils Relative: 0.2 % (ref 0.0–3.0)
EOS ABS: 0.1 10*3/uL (ref 0.0–0.7)
Eosinophils Relative: 2.3 % (ref 0.0–5.0)
HEMATOCRIT: 41 % (ref 36.0–46.0)
HEMOGLOBIN: 13.6 g/dL (ref 12.0–15.0)
LYMPHS ABS: 1.9 10*3/uL (ref 0.7–4.0)
Lymphocytes Relative: 33.9 % (ref 12.0–46.0)
MCHC: 33.3 g/dL (ref 30.0–36.0)
MCV: 92.2 fl (ref 78.0–100.0)
Monocytes Absolute: 0.4 10*3/uL (ref 0.1–1.0)
Monocytes Relative: 6.9 % (ref 3.0–12.0)
NEUTROS ABS: 3.2 10*3/uL (ref 1.4–7.7)
Neutrophils Relative %: 56.7 % (ref 43.0–77.0)
Platelets: 212 10*3/uL (ref 150.0–400.0)
RBC: 4.45 Mil/uL (ref 3.87–5.11)
RDW: 13.6 % (ref 11.5–14.6)
WBC: 5.7 10*3/uL (ref 4.5–10.5)

## 2013-06-05 LAB — COMPREHENSIVE METABOLIC PANEL
ALBUMIN: 4 g/dL (ref 3.5–5.2)
ALK PHOS: 75 U/L (ref 39–117)
ALT: 22 U/L (ref 0–35)
AST: 27 U/L (ref 0–37)
BILIRUBIN TOTAL: 0.7 mg/dL (ref 0.3–1.2)
BUN: 16 mg/dL (ref 6–23)
CO2: 27 mEq/L (ref 19–32)
Calcium: 9 mg/dL (ref 8.4–10.5)
Chloride: 102 mEq/L (ref 96–112)
Creatinine, Ser: 0.7 mg/dL (ref 0.4–1.2)
GFR: 99.05 mL/min (ref >60.00)
GLUCOSE: 101 mg/dL — AB (ref 70–99)
Potassium: 4.1 mEq/L (ref 3.5–5.1)
Sodium: 139 mEq/L (ref 135–145)
Total Protein: 7.2 g/dL (ref 6.0–8.3)

## 2013-06-05 LAB — LIPID PANEL
CHOLESTEROL: 177 mg/dL (ref 0–200)
HDL: 58.4 mg/dL (ref >39.00)
LDL Cholesterol: 94 mg/dL (ref 0–99)
Total CHOL/HDL Ratio: 3
Triglycerides: 123 mg/dL (ref 0.0–149.0)
VLDL: 24.6 mg/dL (ref 0.0–40.0)

## 2013-06-05 LAB — VITAMIN D 25 HYDROXY (VIT D DEFICIENCY, FRACTURES): Vit D, 25-Hydroxy: 41 ng/mL (ref 30–89)

## 2013-06-05 LAB — TSH: TSH: 0.32 u[IU]/mL — AB (ref 0.35–5.50)

## 2013-06-05 LAB — HEMOGLOBIN A1C: HEMOGLOBIN A1C: 6 % (ref 4.6–6.5)

## 2013-09-06 ENCOUNTER — Other Ambulatory Visit: Payer: Self-pay | Admitting: Family Medicine

## 2013-09-06 DIAGNOSIS — R7989 Other specified abnormal findings of blood chemistry: Secondary | ICD-10-CM

## 2013-09-07 ENCOUNTER — Other Ambulatory Visit (INDEPENDENT_AMBULATORY_CARE_PROVIDER_SITE_OTHER): Payer: BC Managed Care – PPO

## 2013-09-07 DIAGNOSIS — R7989 Other specified abnormal findings of blood chemistry: Secondary | ICD-10-CM

## 2013-09-07 LAB — TSH: TSH: 1.22 u[IU]/mL (ref 0.35–4.50)

## 2013-09-07 LAB — T4, FREE: FREE T4: 0.83 ng/dL (ref 0.60–1.60)

## 2013-09-07 LAB — T3, FREE: T3, Free: 2.6 pg/mL (ref 2.3–4.2)

## 2014-05-26 IMAGING — US ABDOMEN ULTRASOUND LIMITED
1 series · 14 of 25 positions shown · non-contrast
Comparison: none

REASON FOR EXAM: hypertension high cholesterol morbid obesity  diabetes
COMMENTS:

PROCEDURE:     US  - US ABDOMEN LIMITED SURVEY  - December 22, 2011  [DATE]
RESULT:     Comparison: None
TECHNIQUE: Multiple gray-scale and color-flow Doppler images of the right
upper quadrant are presented for review.

[Series 1: abdomen ultrasound limited · 0.33mm/px · 14 of 40 slices shown]
[im 1/40]
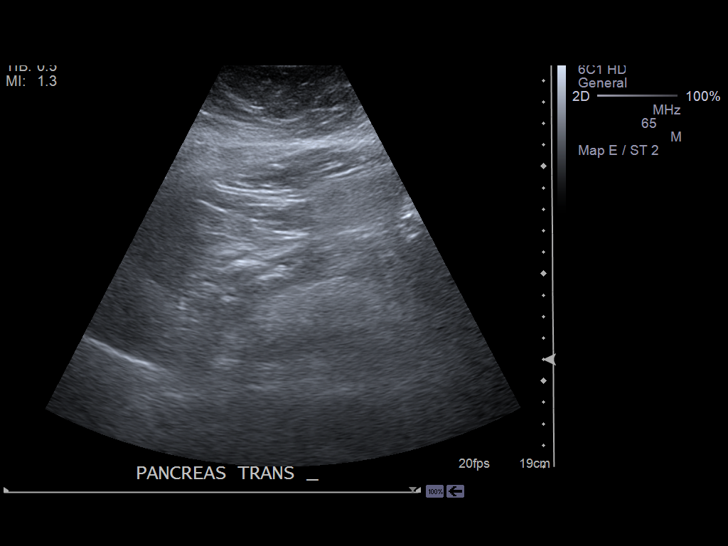
[im 4/40]
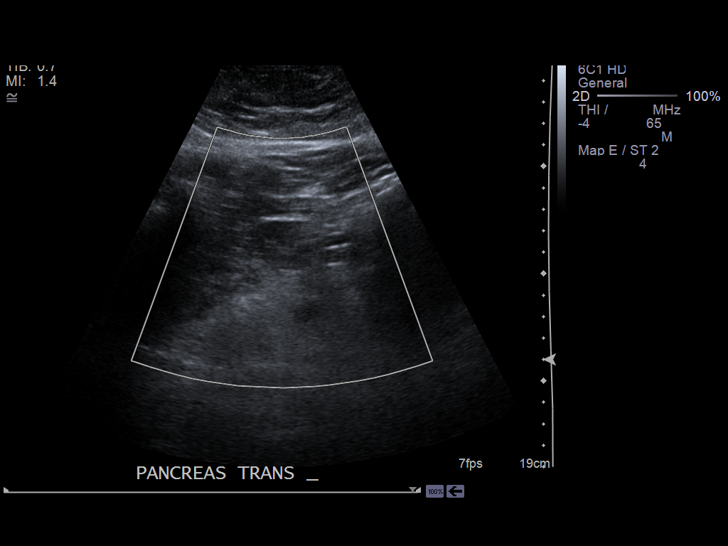
[im 7/40]
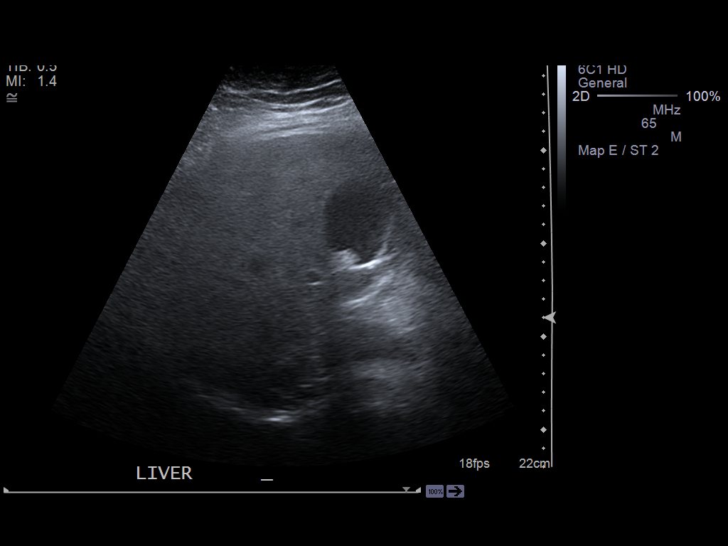
[im 10/40]
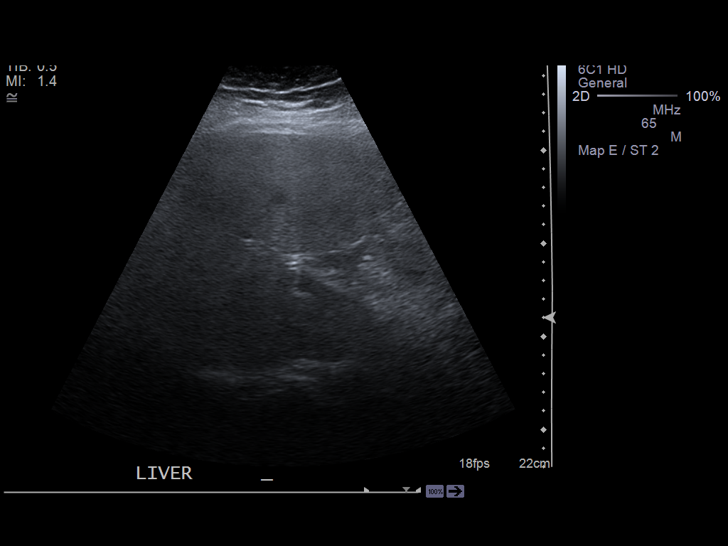
[im 14/40]
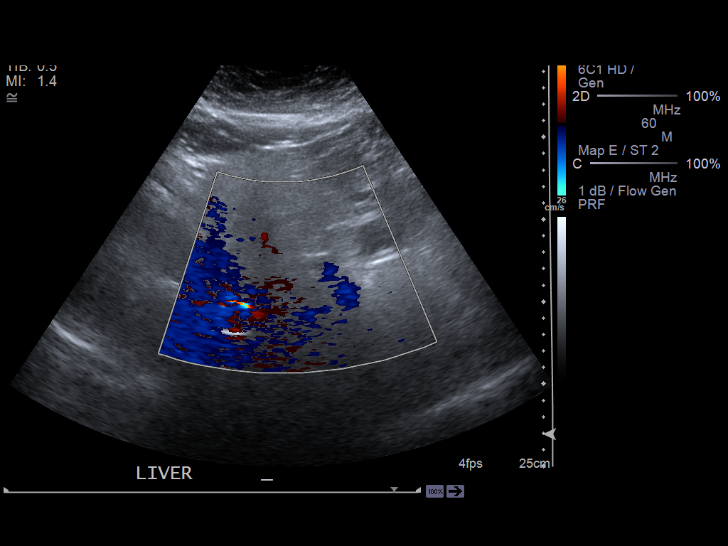
[im 15/40]
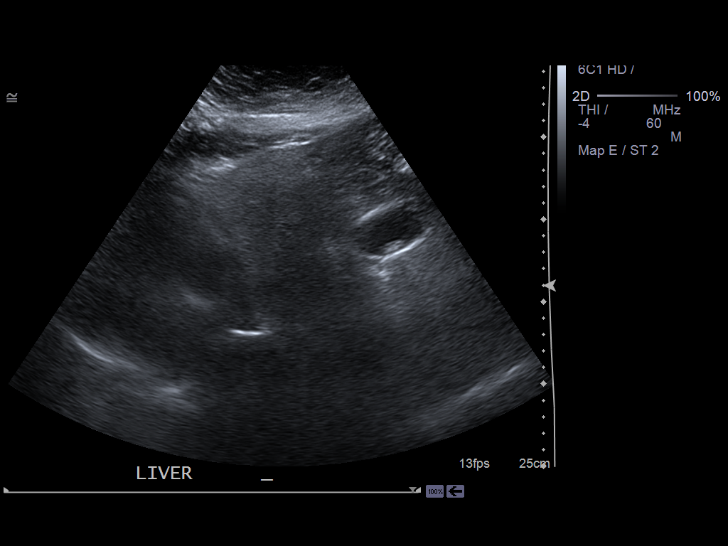
[im 18/40]
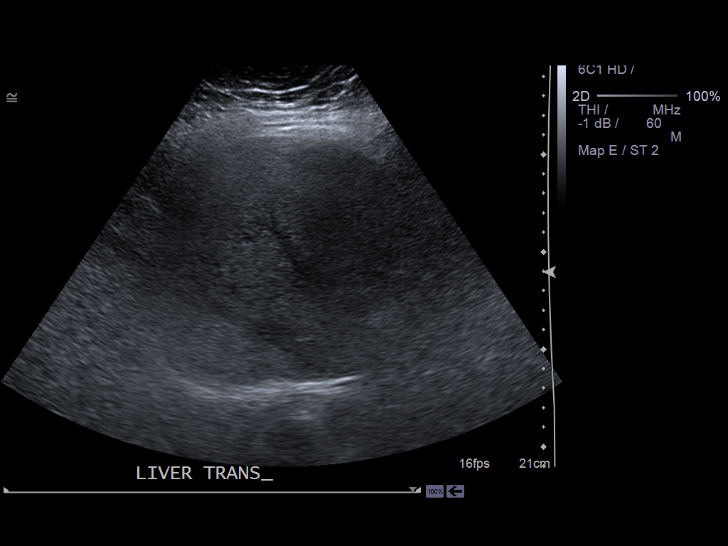
[im 22/40]
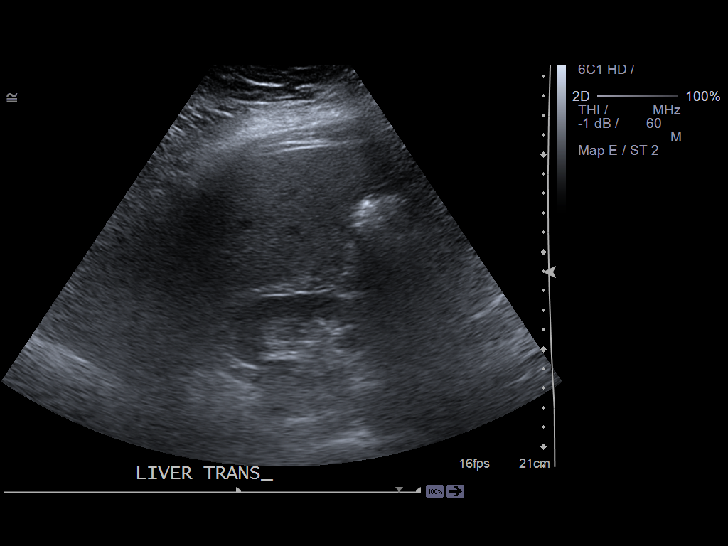
[im 25/40]
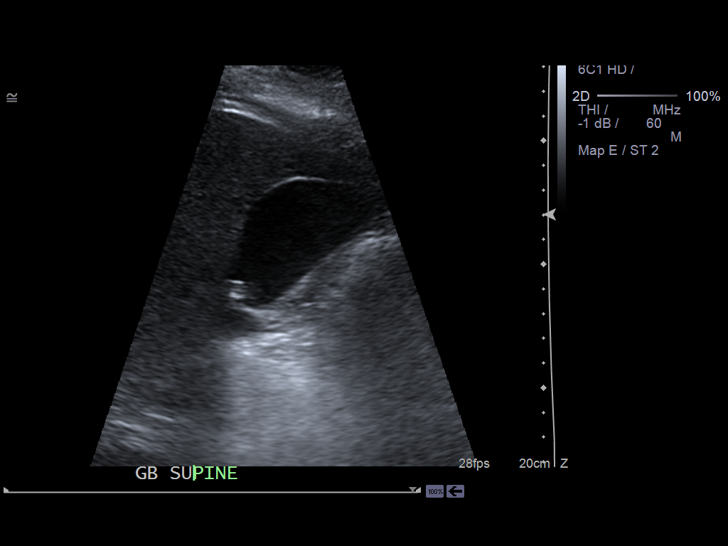
[im 27/40]
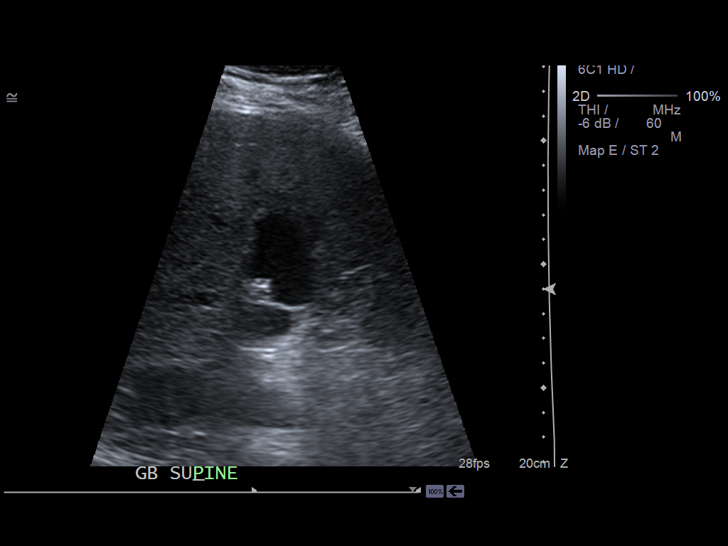
[im 30/40]
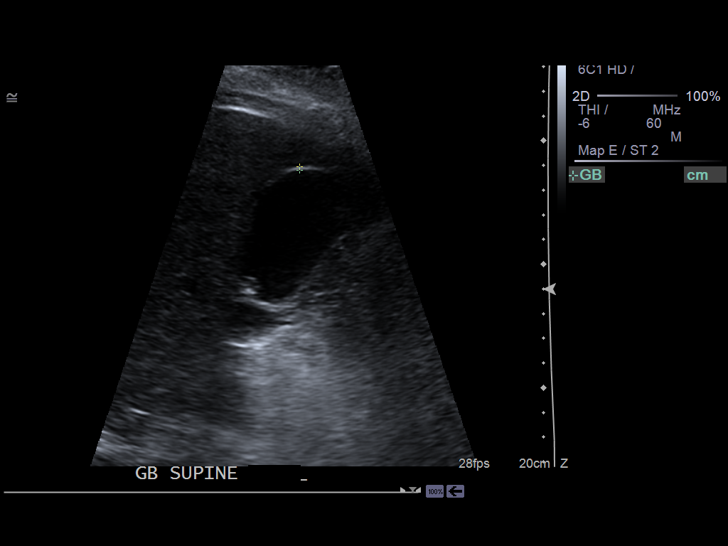
[im 33/40]
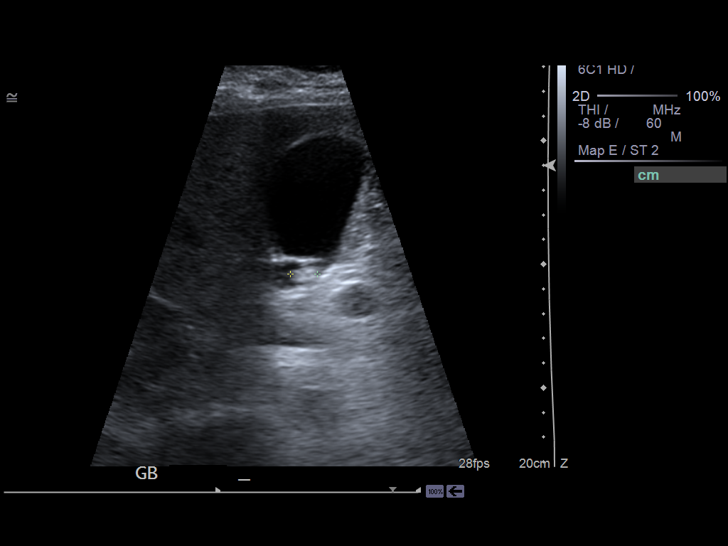
[im 36/40]
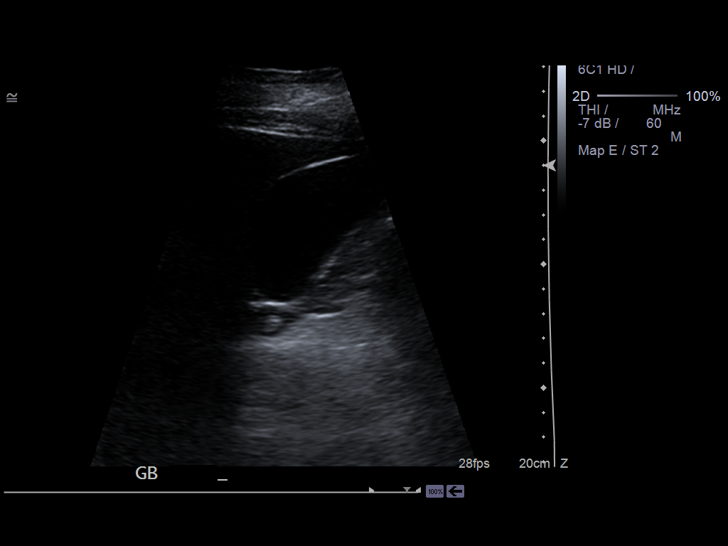
[im 40/40]
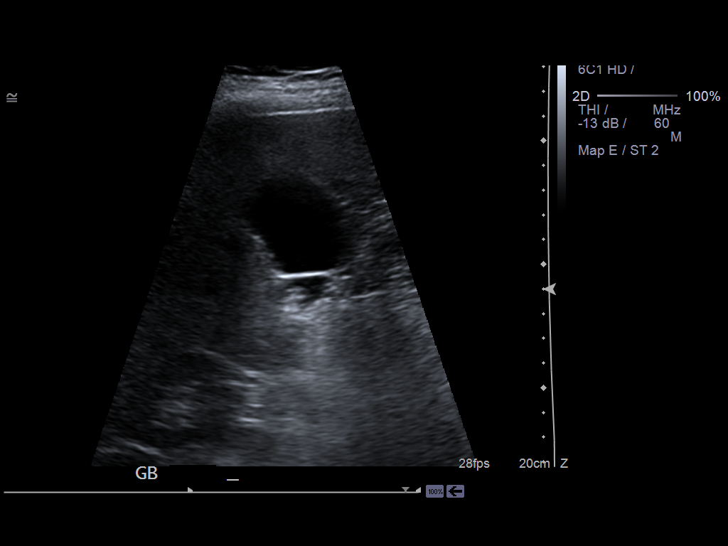

[14 of 25 positions shown; findings below may reference images not displayed]

FINDINGS: The liver is increased in echogenicity as can be seen with hepatic
steatosis. The liver is without evidence of a focal hepatic lesion.

There are small echogenic foci within the gallbladder which may represent
tiny cholelithiasis versus sludge. There is no intra- or extrahepatic
biliary ductal dilatation. The common duct measures 3.5 mm in maximal
diameter. There is no gallbladder wall thickening, pericholecystic fluid, or
sonographic Murphy's sign.

The visualized portion of the pancreas is normal in echogenicity.
IMPRESSION: There are small echogenic foci within the gallbladder which may represent
tiny cholelithiasis versus sludge.

Hepatic steatosis.

[REDACTED]

## 2014-06-02 ENCOUNTER — Telehealth: Payer: Self-pay | Admitting: Family Medicine

## 2014-06-02 DIAGNOSIS — Z0000 Encounter for general adult medical examination without abnormal findings: Secondary | ICD-10-CM

## 2014-06-02 DIAGNOSIS — R739 Hyperglycemia, unspecified: Secondary | ICD-10-CM

## 2014-06-02 DIAGNOSIS — E559 Vitamin D deficiency, unspecified: Secondary | ICD-10-CM

## 2014-06-02 NOTE — Telephone Encounter (Signed)
-----   Message from Alvina Chouerri J Walsh sent at 05/29/2014  3:39 PM EST ----- Regarding: Lab orders for Monday, 3.14.16 Patient is scheduled for CPX labs, please order future labs, Thanks , Camelia Engerri

## 2014-06-03 ENCOUNTER — Other Ambulatory Visit (INDEPENDENT_AMBULATORY_CARE_PROVIDER_SITE_OTHER): Payer: BC Managed Care – PPO

## 2014-06-03 DIAGNOSIS — Z0000 Encounter for general adult medical examination without abnormal findings: Secondary | ICD-10-CM

## 2014-06-03 DIAGNOSIS — R739 Hyperglycemia, unspecified: Secondary | ICD-10-CM

## 2014-06-03 DIAGNOSIS — E559 Vitamin D deficiency, unspecified: Secondary | ICD-10-CM

## 2014-06-03 LAB — COMPREHENSIVE METABOLIC PANEL
ALBUMIN: 3.8 g/dL (ref 3.5–5.2)
ALT: 15 U/L (ref 0–35)
AST: 18 U/L (ref 0–37)
Alkaline Phosphatase: 79 U/L (ref 39–117)
BUN: 14 mg/dL (ref 6–23)
CHLORIDE: 106 meq/L (ref 96–112)
CO2: 29 meq/L (ref 19–32)
Calcium: 9.1 mg/dL (ref 8.4–10.5)
Creatinine, Ser: 0.69 mg/dL (ref 0.40–1.20)
GFR: 92.15 mL/min (ref >60.00)
Glucose, Bld: 125 mg/dL — ABNORMAL HIGH (ref 70–99)
POTASSIUM: 4 meq/L (ref 3.5–5.1)
Sodium: 139 mEq/L (ref 135–145)
TOTAL PROTEIN: 6.6 g/dL (ref 6.0–8.3)
Total Bilirubin: 0.4 mg/dL (ref 0.2–1.2)

## 2014-06-03 LAB — CBC WITH DIFFERENTIAL/PLATELET
Basophils Absolute: 0 10*3/uL (ref 0.0–0.1)
Basophils Relative: 0.6 % (ref 0.0–3.0)
Eosinophils Absolute: 0.2 10*3/uL (ref 0.0–0.7)
Eosinophils Relative: 2.6 % (ref 0.0–5.0)
HEMATOCRIT: 40.8 % (ref 36.0–46.0)
HEMOGLOBIN: 13.9 g/dL (ref 12.0–15.0)
Lymphocytes Relative: 34.6 % (ref 12.0–46.0)
Lymphs Abs: 2 10*3/uL (ref 0.7–4.0)
MCHC: 34.1 g/dL (ref 30.0–36.0)
MCV: 88.9 fl (ref 78.0–100.0)
MONO ABS: 0.4 10*3/uL (ref 0.1–1.0)
MONOS PCT: 7.6 % (ref 3.0–12.0)
NEUTROS ABS: 3.2 10*3/uL (ref 1.4–7.7)
Neutrophils Relative %: 54.6 % (ref 43.0–77.0)
Platelets: 249 10*3/uL (ref 150.0–400.0)
RBC: 4.6 Mil/uL (ref 3.87–5.11)
RDW: 13.6 % (ref 11.5–15.5)
WBC: 5.8 10*3/uL (ref 4.0–10.5)

## 2014-06-03 LAB — LIPID PANEL
CHOL/HDL RATIO: 3
Cholesterol: 183 mg/dL (ref 0–200)
HDL: 61 mg/dL (ref >39.00)
LDL Cholesterol: 97 mg/dL (ref 0–99)
NonHDL: 122
Triglycerides: 124 mg/dL (ref 0.0–149.0)
VLDL: 24.8 mg/dL (ref 0.0–40.0)

## 2014-06-03 LAB — HEMOGLOBIN A1C: HEMOGLOBIN A1C: 6.1 % (ref 4.6–6.5)

## 2014-06-03 LAB — VITAMIN D 25 HYDROXY (VIT D DEFICIENCY, FRACTURES): VITD: 28.67 ng/mL — ABNORMAL LOW (ref 30.00–100.00)

## 2014-06-03 LAB — TSH: TSH: 1.94 u[IU]/mL (ref 0.35–4.50)

## 2014-06-10 ENCOUNTER — Ambulatory Visit (INDEPENDENT_AMBULATORY_CARE_PROVIDER_SITE_OTHER): Payer: BC Managed Care – PPO | Admitting: Family Medicine

## 2014-06-10 ENCOUNTER — Encounter: Payer: Self-pay | Admitting: Family Medicine

## 2014-06-10 VITALS — BP 120/72 | HR 65 | Temp 98.0°F | Ht 66.5 in | Wt 245.0 lb

## 2014-06-10 DIAGNOSIS — R739 Hyperglycemia, unspecified: Secondary | ICD-10-CM

## 2014-06-10 DIAGNOSIS — Z0000 Encounter for general adult medical examination without abnormal findings: Secondary | ICD-10-CM

## 2014-06-10 DIAGNOSIS — E785 Hyperlipidemia, unspecified: Secondary | ICD-10-CM

## 2014-06-10 DIAGNOSIS — E669 Obesity, unspecified: Secondary | ICD-10-CM

## 2014-06-10 DIAGNOSIS — Z1231 Encounter for screening mammogram for malignant neoplasm of breast: Secondary | ICD-10-CM | POA: Insufficient documentation

## 2014-06-10 DIAGNOSIS — E559 Vitamin D deficiency, unspecified: Secondary | ICD-10-CM

## 2014-06-10 DIAGNOSIS — Z23 Encounter for immunization: Secondary | ICD-10-CM

## 2014-06-10 DIAGNOSIS — Z1211 Encounter for screening for malignant neoplasm of colon: Secondary | ICD-10-CM

## 2014-06-10 NOTE — Assessment & Plan Note (Signed)
Scheduled annual screening mammogram Nl breast exam today  Encouraged monthly self exams   

## 2014-06-10 NOTE — Assessment & Plan Note (Signed)
Reviewed health habits including diet and exercise and skin cancer prevention Reviewed appropriate screening tests for age  Also reviewed health mt list, fam hx and immunization status , as well as social and family history   See HPI Labs reviewed Enc pt to call ins re: shingles vaccine  Declined pneumovax  this year Forgot flu shot -will get next season  [facility] Declines colonoscopy for now (despite fam hx)-given ifob card

## 2014-06-10 NOTE — Assessment & Plan Note (Signed)
Discussed how this problem influences overall health and the risks it imposes  Reviewed plan for weight loss with lower calorie diet (via better food choices and also portion control or program like weight watchers) and exercise building up to or more than 30 minutes 5 days per week including some aerobic activity   S/p bariatric surgery in the past Still loosing wt slowly Disc plan to get back to exercise

## 2014-06-10 NOTE — Assessment & Plan Note (Signed)
Pt continues to decline colonosc despite fam hx  emph imp of getting this IFOB card given Hopefully will change her mind and call to schedule within the year

## 2014-06-10 NOTE — Progress Notes (Signed)
Subjective:    Patient ID: Sara Glover, female    DOB: Aug 06, 1955, 59 y.o.   MRN: [id number]  HPI Here for health maintenance exam and to review chronic medical problems    Doing well  Feeling well   Wt is down 3 lb with bmi of 38 - the wt loss is slower  Not doing her best - needs to exercise more - likes to walk and bike  Eating is still good  (had bariatric surgery in the past)- still good choices and small portions   HIV/ Hep C - not interested in screening   Flu shot - did not get this season - she forgot   Mammogram 9/10 normal  Self exam - no lumps  She has gone to [facility] in the past  We will refer her for that   No gyn problems  Pap was 4/13 nl  She declines gyn exam at this time  Aware of ovarian cancer in family   Colon cancer screening  Mother had colon cancer  She has not had colon cancer She does not want to have a colonoscopy yet - will call back to schedule later this year  No stool changes   Pneumovax 9/10 - she wants to wait another year on that  Has not had shingles vaccine   opth- last exam was just over a year ago   Hyperglycemia Lab Results  Component Value Date   HGBA1C 6.1 06/03/2014  this is up from 6.0 last time  Doing well avoiding sugar and carbs   Hx of D def Level is 28  Takes MVI and also vit D - takes 2000 iu   Hyperlipidemia Lab Results  Component Value Date   CHOL 183 06/03/2014   CHOL 177 06/04/2013   CHOL 148 03/14/2012   Lab Results  Component Value Date   HDL 61.00 06/03/2014   HDL 58.40 06/04/2013   HDL 34.90* 03/14/2012   Lab Results  Component Value Date   LDLCALC 97 06/03/2014   LDLCALC 94 06/04/2013   LDLCALC 79 03/14/2012   Lab Results  Component Value Date   TRIG 124.0 06/03/2014   TRIG 123.0 06/04/2013   TRIG 169.0* 03/14/2012   Lab Results  Component Value Date   CHOLHDL 3 06/03/2014   CHOLHDL 3 06/04/2013   CHOLHDL 4 03/14/2012   Lab Results  Component Value Date   LDLDIRECT 87.3 02/06/2010   LDLDIRECT 96.7 11/20/2008    Good profile!     Chemistry      Component Value Date/Time   NA 139 06/03/2014 0822   K 4.0 06/03/2014 0822   CL 106 06/03/2014 0822   CO2 29 06/03/2014 0822   BUN 14 06/03/2014 0822   CREATININE 0.69 06/03/2014 0822      Component Value Date/Time   CALCIUM 9.1 06/03/2014 0822   ALKPHOS 79 06/03/2014 0822   AST 18 06/03/2014 0822   ALT 15 06/03/2014 0822   BILITOT 0.4 06/03/2014 0822      Lab Results  Component Value Date   TSH 1.94 06/03/2014   Lab Results  Component Value Date   WBC 5.8 06/03/2014   HGB 13.9 06/03/2014   HCT 40.8 06/03/2014   MCV 88.9 06/03/2014   PLT 249.0 06/03/2014     Patient Active Problem List   Diagnosis Date Noted  . Colon cancer screening 06/04/2013  . Vitamin D deficiency disease 06/04/2013  . Routine gynecological examination 03/17/2012  . Routine general medical  examination at a health care facility 03/09/2012  . Sleep apnea 01/19/2012  . Blood clot in vein 07/25/2011  . Obesity 06/14/2011  . Hyperlipidemia 11/20/2008  . Hyperglycemia 11/20/2008   Past Medical History  Diagnosis Date  . Diabetes mellitus 2004    type II  . Hyperlipidemia 2004  . Hypertension 2004  . Morbid obesity   . Sleep apnea    Past Surgical History  Procedure Laterality Date  . Cesarean section    . Bariactric surgery    . Cholecystectomy     History  Substance Use Topics  . Smoking status: Never Smoker   . Smokeless tobacco: Never Used  . Alcohol Use: No   Family History  Problem Relation Age of Onset  . Diabetes Mother   . Cancer Mother     colon CA  . Schizophrenia Mother   . Diabetes Father   . Cancer Sister     ovarian CA in 6940s  . Diabetes Brother    Allergies  Allergen Reactions  . Metformin     REACTION: diarrhea   No current outpatient prescriptions on file prior to visit.   No current facility-administered medications on file prior to visit.    Review of  Systems Review of Systems  Constitutional: Negative for fever, appetite change, fatigue and unexpected weight change.  Eyes: Negative for pain and visual disturbance.  Respiratory: Negative for cough and shortness of breath.   Cardiovascular: Negative for cp or palpitations    Gastrointestinal: Negative for nausea, diarrhea and constipation.  Genitourinary: Negative for urgency and frequency.  Skin: Negative for pallor or rash   Neurological: Negative for weakness, light-headedness, numbness and headaches.  Hematological: Negative for adenopathy. Does not bruise/bleed easily.  Psychiatric/Behavioral: Negative for dysphoric mood. The patient is not nervous/anxious.         Objective:   Physical Exam  Constitutional: She appears well-developed and well-nourished. No distress.  obese and well appearing   HENT:  Head: Normocephalic and atraumatic.  Right Ear: External ear normal.  Left Ear: External ear normal.  Nose: Nose normal.  Mouth/Throat: Oropharynx is clear and moist.  Eyes: Conjunctivae and EOM are normal. Pupils are equal, round, and reactive to light. Right eye exhibits no discharge. Left eye exhibits no discharge. No scleral icterus.  Neck: Normal range of motion. Neck supple. No JVD present. Carotid bruit is not present. No thyromegaly present.  Cardiovascular: Normal rate, regular rhythm, normal heart sounds and intact distal pulses.  Exam reveals no gallop.   Pulmonary/Chest: Effort normal and breath sounds normal. No respiratory distress. She has no wheezes. She has no rales.  Abdominal: Soft. Bowel sounds are normal. She exhibits no distension and no mass. There is no tenderness.  Genitourinary:  Breast exam: No mass, nodules, thickening, tenderness, bulging, retraction, inflamation, nipple discharge or skin changes noted.  No axillary or clavicular LA.      Musculoskeletal: She exhibits no edema or tenderness.  Lymphadenopathy:    She has no cervical adenopathy.    Neurological: She is alert. She has normal reflexes. No cranial nerve deficit. She exhibits normal muscle tone. Coordination normal.  Skin: Skin is warm and dry. No rash noted. No erythema. No pallor.  Hyperpigmentation of skin and scars on legs   Psychiatric: She has a normal mood and affect.          Assessment & Plan:   Problem List Items Addressed This Visit      Other   Colon cancer  screening    Pt continues to decline colonosc despite fam hx  emph imp of getting this IFOB card given Hopefully will change her mind and call to schedule within the year        Relevant Orders   Fecal occult blood, imunochemical   Encounter for screening mammogram for breast cancer    Scheduled annual screening mammogram Nl breast exam today  Encouraged monthly self exams         Relevant Orders   MM DIGITAL SCREENING BILATERAL   Hyperglycemia    Stable  Still no longer in DM range  No symptoms  Enc further wt loss  Enc to get back to exercise       Hyperlipidemia - Primary    Disc goals for lipids and reasons to control them Rev labs with pt Rev low sat fat diet in detail Improved        Obesity    Discussed how this problem influences overall health and the risks it imposes  Reviewed plan for weight loss with lower calorie diet (via better food choices and also portion control or program like weight watchers) and exercise building up to or more than 30 minutes 5 days per week including some aerobic activity   S/p bariatric surgery in the past Still loosing wt slowly Disc plan to get back to exercise       Routine general medical examination at a health care facility    Reviewed health habits including diet and exercise and skin cancer prevention Reviewed appropriate screening tests for age  Also reviewed health mt list, fam hx and immunization status , as well as social and family history   See HPI Labs reviewed Enc pt to call ins re: shingles vaccine  Declined  pneumovax  this year Forgot flu shot -will get next season  Mammogram ref given to go to [facility] Declines colonoscopy for now (despite fam hx)-given ifob card       Vitamin D deficiency disease    D level is 28  Will inc her otc D to 4000 iu daily  Disc imp to bone and overall health       Other Visit Diagnoses    Need for prophylactic vaccination against Streptococcus pneumoniae (pneumococcus)        Relevant Orders    Pneumococcal conjugate vaccine 13-valent IM (Completed)

## 2014-06-10 NOTE — Assessment & Plan Note (Signed)
D level is 28  Will inc her otc D to 4000 iu daily  Disc imp to bone and overall health

## 2014-06-10 NOTE — Progress Notes (Signed)
Pre visit review using our clinic review tool, if applicable. No additional management support is needed unless otherwise documented below in the visit note. 

## 2014-06-10 NOTE — Assessment & Plan Note (Signed)
Stable  Still no longer in DM range  No symptoms  Enc further wt loss  Enc to get back to exercise

## 2014-06-10 NOTE — Assessment & Plan Note (Signed)
Disc goals for lipids and reasons to control them Rev labs with pt Rev low sat fat diet in detail Improved  

## 2014-06-10 NOTE — Patient Instructions (Signed)
If you are interested in a shingles/zoster vaccine - call your insurance to check on coverage,( you should not get it within 1 month of other vaccines) , then call us for a prescription  for it to take to a pharmacy that gives the shot , or make a nurse visit to get it here depending on your coverage  Please do stool cards for colon cancer screening - call us when ready to schedule colonoscopy Stop at check out to get referral for mammogram  Increase vitamin D over the counter to 4000 iu daily   Get back to exercise

## 2014-06-28 ENCOUNTER — Ambulatory Visit
Admit: 2014-06-28 | Disposition: A | Discharge status: Home / Self Care | Payer: Self-pay | Attending: Family Medicine | Admitting: Family Medicine

## 2014-06-28 LAB — HM MAMMOGRAPHY

## 2014-07-02 ENCOUNTER — Encounter: Payer: Self-pay | Admitting: Family Medicine

## 2014-09-12 ENCOUNTER — Encounter: Payer: Self-pay | Admitting: *Deleted

## 2015-06-08 ENCOUNTER — Telehealth: Payer: Self-pay | Admitting: Family Medicine

## 2015-06-08 DIAGNOSIS — Z0000 Encounter for general adult medical examination without abnormal findings: Secondary | ICD-10-CM

## 2015-06-08 DIAGNOSIS — E559 Vitamin D deficiency, unspecified: Secondary | ICD-10-CM

## 2015-06-08 DIAGNOSIS — R739 Hyperglycemia, unspecified: Secondary | ICD-10-CM

## 2015-06-08 DIAGNOSIS — E669 Obesity, unspecified: Secondary | ICD-10-CM

## 2015-06-08 NOTE — Telephone Encounter (Signed)
-----   Message from Baldomero LamyNatasha C Chavers sent at 06/02/2015  1:52 PM EDT ----- Regarding: Cpx labs Tues 3/21, need orders. Thanks! :-) Please order  future cpx labs for pt's upcoming lab appt. Thanks Rodney Boozeasha

## 2015-06-10 ENCOUNTER — Other Ambulatory Visit: Payer: BC Managed Care – PPO

## 2015-06-16 ENCOUNTER — Encounter: Payer: BC Managed Care – PPO | Admitting: Family Medicine

## 2015-11-27 ENCOUNTER — Other Ambulatory Visit: Payer: BC Managed Care – PPO

## 2015-12-02 ENCOUNTER — Encounter: Payer: BC Managed Care – PPO | Admitting: Family Medicine

## 2016-01-23 ENCOUNTER — Encounter: Payer: Self-pay | Admitting: Family Medicine

## 2016-01-23 ENCOUNTER — Ambulatory Visit (INDEPENDENT_AMBULATORY_CARE_PROVIDER_SITE_OTHER): Payer: BC Managed Care – PPO | Admitting: Family Medicine

## 2016-01-23 ENCOUNTER — Other Ambulatory Visit (HOSPITAL_COMMUNITY)
Admission: RE | Admit: 2016-01-23 | Discharge: 2016-01-23 | Disposition: A | Discharge status: Home / Self Care | Payer: BC Managed Care – PPO | Source: Ambulatory Visit | Attending: Family Medicine | Admitting: Family Medicine

## 2016-01-23 VITALS — BP 126/68 | HR 63 | Temp 97.7°F | Ht 66.25 in | Wt 261.0 lb

## 2016-01-23 DIAGNOSIS — Z01419 Encounter for gynecological examination (general) (routine) without abnormal findings: Secondary | ICD-10-CM

## 2016-01-23 DIAGNOSIS — Z1211 Encounter for screening for malignant neoplasm of colon: Secondary | ICD-10-CM

## 2016-01-23 DIAGNOSIS — Z0000 Encounter for general adult medical examination without abnormal findings: Secondary | ICD-10-CM | POA: Diagnosis not present

## 2016-01-23 DIAGNOSIS — R739 Hyperglycemia, unspecified: Secondary | ICD-10-CM

## 2016-01-23 DIAGNOSIS — E559 Vitamin D deficiency, unspecified: Secondary | ICD-10-CM | POA: Diagnosis not present

## 2016-01-23 DIAGNOSIS — E7800 Pure hypercholesterolemia, unspecified: Secondary | ICD-10-CM | POA: Diagnosis not present

## 2016-01-23 DIAGNOSIS — Z23 Encounter for immunization: Secondary | ICD-10-CM

## 2016-01-23 DIAGNOSIS — Z1151 Encounter for screening for human papillomavirus (HPV): Secondary | ICD-10-CM | POA: Insufficient documentation

## 2016-01-23 LAB — LIPID PANEL
CHOL/HDL RATIO: 3
Cholesterol: 193 mg/dL (ref 0–200)
HDL: 68.1 mg/dL (ref >39.00)
LDL Cholesterol: 103 mg/dL — ABNORMAL HIGH (ref 0–99)
NonHDL: 124.89
TRIGLYCERIDES: 110 mg/dL (ref 0.0–149.0)
VLDL: 22 mg/dL (ref 0.0–40.0)

## 2016-01-23 LAB — CBC WITH DIFFERENTIAL/PLATELET
Basophils Absolute: 0 10*3/uL (ref 0.0–0.1)
Basophils Relative: 0.5 % (ref 0.0–3.0)
EOS PCT: 1.9 % (ref 0.0–5.0)
Eosinophils Absolute: 0.1 10*3/uL (ref 0.0–0.7)
HCT: 41.5 % (ref 36.0–46.0)
HEMOGLOBIN: 14 g/dL (ref 12.0–15.0)
LYMPHS ABS: 1.6 10*3/uL (ref 0.7–4.0)
Lymphocytes Relative: 28 % (ref 12.0–46.0)
MCHC: 33.6 g/dL (ref 30.0–36.0)
MCV: 87.9 fl (ref 78.0–100.0)
MONO ABS: 0.5 10*3/uL (ref 0.1–1.0)
Monocytes Relative: 8.2 % (ref 3.0–12.0)
NEUTROS ABS: 3.4 10*3/uL (ref 1.4–7.7)
Neutrophils Relative %: 61.4 % (ref 43.0–77.0)
PLATELETS: 207 10*3/uL (ref 150.0–400.0)
RBC: 4.72 Mil/uL (ref 3.87–5.11)
RDW: 13.8 % (ref 11.5–15.5)
WBC: 5.6 10*3/uL (ref 4.0–10.5)

## 2016-01-23 LAB — VITAMIN D 25 HYDROXY (VIT D DEFICIENCY, FRACTURES): VITD: 23.33 ng/mL — ABNORMAL LOW (ref 30.00–100.00)

## 2016-01-23 LAB — COMPREHENSIVE METABOLIC PANEL
ALBUMIN: 4.1 g/dL (ref 3.5–5.2)
ALT: 12 U/L (ref 0–35)
AST: 15 U/L (ref 0–37)
Alkaline Phosphatase: 97 U/L (ref 39–117)
BUN: 12 mg/dL (ref 6–23)
CHLORIDE: 106 meq/L (ref 96–112)
CO2: 28 mEq/L (ref 19–32)
Calcium: 9.2 mg/dL (ref 8.4–10.5)
Creatinine, Ser: 0.76 mg/dL (ref 0.40–1.20)
GFR: 81.98 mL/min (ref >60.00)
Glucose, Bld: 128 mg/dL — ABNORMAL HIGH (ref 70–99)
POTASSIUM: 4.5 meq/L (ref 3.5–5.1)
SODIUM: 141 meq/L (ref 135–145)
Total Bilirubin: 0.5 mg/dL (ref 0.2–1.2)
Total Protein: 7 g/dL (ref 6.0–8.3)

## 2016-01-23 LAB — HEMOGLOBIN A1C: HEMOGLOBIN A1C: 6.3 % (ref 4.6–6.5)

## 2016-01-23 LAB — TSH: TSH: 1.76 u[IU]/mL (ref 0.35–4.50)

## 2016-01-23 NOTE — Assessment & Plan Note (Signed)
Diet controlled Labs today Rev low sat/trans fat diet and exercise

## 2016-01-23 NOTE — Progress Notes (Signed)
Pre visit review using our clinic review tool, if applicable. No additional management support is needed unless otherwise documented below in the visit note. 

## 2016-01-23 NOTE — Assessment & Plan Note (Signed)
Declines screening colonoscopy Urged to re consider given family hx  She was given ifob kit  Also info on cologuard

## 2016-01-23 NOTE — Assessment & Plan Note (Signed)
Routine exam  Pap done  No c/o fam hx of ovarian cancer  Cystocele noted

## 2016-01-23 NOTE — Assessment & Plan Note (Signed)
Not taking D  Enc her to start 2000 iu daily  Level today  Imp for bone and overall health

## 2016-01-23 NOTE — Patient Instructions (Addendum)
Don't forget to get your eye exam  Do the ifob kit for colon cancer screening - and then let us know when you are ready for a colonoscopy  Labs today  Don't forget to schedule your annual mammogram-you are over due   If you are interested in a shingles/zoster vaccine - call your insurance to check on coverage,( you should not get it within 1 month of other vaccines) , then call us for a prescription  for it to take to a pharmacy that gives the shot , or make a nurse visit to get it here depending on your coverage  Your vitamin D level has been low in the past  This is important for bone health- and put you at risk for osteoporosis Aim for 2000 iu daily   Pap and gyn exam today

## 2016-01-23 NOTE — Assessment & Plan Note (Signed)
Discussed how this problem influences overall health and the risks it imposes  Reviewed plan for weight loss with lower calorie diet (via better food choices and also portion control or program like weight watchers) and exercise building up to or more than 30 minutes 5 days per week including some aerobic activity   Pt cannot site particular reason for wt gain  Enc making more time for exercise  Continue healthy diet-minimize snacking

## 2016-01-23 NOTE — Assessment & Plan Note (Signed)
Reviewed health habits including diet and exercise and skin cancer prevention Reviewed appropriate screening tests for age  Also reviewed health mt list, fam hx and immunization status , as well as social and family history   See HPI Labs ordered today Declines colonoscopy-counseled on this and ifob given along with info on cologuard Gyn exam done today She will schedule her own eye exam Declines zoster vaccine-urged to re consider Flu shot today  Given materials to make her own mammogram appt  In general-pt is not very inclined to pay attn to her health

## 2016-01-23 NOTE — Progress Notes (Signed)
Subjective:    Patient ID: Sara Glover, female    DOB: Jun 25, 1955, 60 y.o.   MRN: [id number]  HPI Here for health maintenance exam and to review chronic medical problems    Doing well overall   Wt Readings from Last 3 Encounters:  01/23/16 261 lb (118.4 kg)  06/10/14 245 lb (111.1 kg)  06/04/13 248 lb 4 oz (112.6 kg)  she is aware this is going up - but lost a lb since last week  Not exercising as much - too busy (taking care of a grandchild)  She is eating healthy meals- small / occ snacks with convenience foods  No emotional eating  Goes to the gym/uses bike and walks  Generally active  bmi is 41.8 Morbid obese  Has had bariatric surgery in the past  Eye exam - not for a while- will get one early next year  Colon cancer screening -never had a colonoscopy (afraid)  She is afraid of the prep  Mother had colon  cancer  Will do ifob  Info given on cologuard   PNA vaccine 9/10  Zoster vaccine - not interested in getting it   Pap 12/13-normal Sister had ovarian cancer  No gyn symptoms at all  Will get pap today    Mammogram 4/16-she is due and will schedule herself ([facility]) Self breast exam-no lumps or changes   Tetanus shot 4/13  Flu vaccine-today   Hx of hyperlipidemia Due for labs Lab Results  Component Value Date   CHOL 183 06/03/2014   HDL 61.00 06/03/2014   LDLCALC 97 06/03/2014   LDLDIRECT 87.3 02/06/2010   TRIG 124.0 06/03/2014   CHOLHDL 3 06/03/2014  no medication  Hx of hyperglycemia Lab Results  Component Value Date   HGBA1C 6.1 06/03/2014   Hx of D def with level in the 20s Due for labs  Not taking any vitamin D   Patient Active Problem List   Diagnosis Date Noted  . Encounter for screening mammogram for breast cancer 06/10/2014  . Colon cancer screening 06/04/2013  . Vitamin D deficiency disease 06/04/2013  . Encounter for routine gynecological examination 03/17/2012  . Routine general medical  examination at a health care facility 03/09/2012  . Sleep apnea 01/19/2012  . Blood clot in vein 07/25/2011  . Morbid obesity ([place]) 06/14/2011  . Hyperlipidemia 11/20/2008  . Hyperglycemia 11/20/2008   Past Medical History:  Diagnosis Date  . Diabetes mellitus 2004   type II  . Hyperlipidemia 2004  . Hypertension 2004  . Morbid obesity ([place])   . Sleep apnea    Past Surgical History:  Procedure Laterality Date  . bariactric surgery    . CESAREAN SECTION    . CHOLECYSTECTOMY     Social History  Substance Use Topics  . Smoking status: Never Smoker  . Smokeless tobacco: Never Used  . Alcohol use No   Family History  Problem Relation Age of Onset  . Diabetes Mother   . Cancer Mother     colon CA  . Schizophrenia Mother   . Diabetes Father   . Cancer Sister     ovarian CA in 17s  . Diabetes Brother    Allergies  Allergen Reactions  . Metformin     REACTION: diarrhea   No current outpatient prescriptions on file prior to visit.   No current facility-administered medications on file prior to visit.     Review of Systems    Review of Systems  Constitutional:  Negative for fever, appetite change,  and unexpected weight change.  Eyes: Negative for pain and visual disturbance.  Respiratory: Negative for cough and shortness of breath.   Cardiovascular: Negative for cp or palpitations    Gastrointestinal: Negative for nausea, diarrhea and constipation.  Genitourinary: Negative for urgency and frequency.  Skin: Negative for pallor or rash   Neurological: Negative for weakness, light-headedness, numbness and headaches.  Hematological: Negative for adenopathy. Does not bruise/bleed easily.  Psychiatric/Behavioral: Negative for dysphoric mood. The patient is not nervous/anxious.  Pos for stressors     Objective:   Physical Exam  Constitutional: She appears well-developed and well-nourished. No distress.  Morbidly obese and well appearing   HENT:  Head: Normocephalic  and atraumatic.  Right Ear: External ear normal.  Left Ear: External ear normal.  Mouth/Throat: Oropharynx is clear and moist.  Eyes: Conjunctivae and EOM are normal. Pupils are equal, round, and reactive to light. No scleral icterus.  Neck: Normal range of motion. Neck supple. No JVD present. Carotid bruit is not present. No thyromegaly present.  Cardiovascular: Normal rate, regular rhythm, normal heart sounds and intact distal pulses.  Exam reveals no gallop.   Pulmonary/Chest: Effort normal and breath sounds normal. No respiratory distress. She has no wheezes. She exhibits no tenderness.  Abdominal: Soft. Bowel sounds are normal. She exhibits no distension, no abdominal bruit and no mass. There is no tenderness.  Genitourinary: No breast swelling, tenderness, discharge or bleeding.  Genitourinary Comments: Breast exam: No mass, nodules, thickening, tenderness, bulging, retraction, inflamation, nipple discharge or skin changes noted.  No axillary or clavicular LA.              Anus appears normal w/o hemorrhoids or masses     External genitalia : nl appearance and hair distribution/no lesions     Urethral meatus : nl size, no lesions or prolapse     Urethra: no masses, tenderness or scarring    Bladder : no masses or tenderness     Vagina: nl general appearance, no discharge or  Lesions, no significant cystocele  or rectocele     Cervix: no lesions/ discharge or friability    Uterus: nl size, contour, position, and mobility (not fixed) , non tender    Adnexa : no masses, tenderness, enlargement or nodularity        Musculoskeletal: Normal range of motion. She exhibits edema. She exhibits no tenderness.  Some hyperpigmentation of lower legs consistent with lymphedema   Lymphadenopathy:    She has no cervical adenopathy.  Neurological: She is alert. She has normal reflexes. No cranial nerve deficit. She exhibits normal muscle tone. Coordination normal.  Skin: Skin  is warm and dry. No rash noted. No erythema. No pallor.  Mildly tanned  Lentigines diffusely Some skin tags   Psychiatric: Her mood appears anxious. Her affect is not blunt, not labile and not inappropriate. She does not exhibit a depressed mood.  Pt is somewhat timid  Seems like she has fear of some medical problems and extreme fear of bad news (? Why she declines some health mt)           Assessment & Plan:   Problem List Items Addressed This Visit      Other   Colon cancer screening    Declines screening colonoscopy Urged to re consider given family hx  She was given ifob kit  Also info on cologuard      Relevant Orders   Fecal occult blood, imunochemical  Encounter for routine gynecological examination    Routine exam  Pap done  No c/o fam hx of ovarian cancer  Cystocele noted       Relevant Orders   Cytology - PAP   Hyperglycemia    A1C today Wt gain noted  ? How habits are Disc imp of low glycemic diet and wt loss to prevent DM      Relevant Orders   Hemoglobin A1c (Completed)   Hyperlipidemia    Diet controlled Labs today Rev low sat/trans fat diet and exercise      Morbid obesity (HCC)    Discussed how this problem influences overall health and the risks it imposes  Reviewed plan for weight loss with lower calorie diet (via better food choices and also portion control or program like weight watchers) and exercise building up to or more than 30 minutes 5 days per week including some aerobic activity   Pt cannot site particular reason for wt gain  Enc making more time for exercise  Continue healthy diet-minimize snacking      Routine general medical examination at a health care facility - Primary    Reviewed health habits including diet and exercise and skin cancer prevention Reviewed appropriate screening tests for age  Also reviewed health mt list, fam hx and immunization status , as well as social and family history   See HPI Labs ordered  today Declines colonoscopy-counseled on this and ifob given along with info on cologuard Gyn exam done today She will schedule her own eye exam Declines zoster vaccine-urged to re consider Flu shot today  Given materials to make her own mammogram appt  In general-pt is not very inclined to pay attn to her health       Relevant Orders   CBC with Differential/Platelet (Completed)   Comprehensive metabolic panel (Completed)   TSH (Completed)   Lipid panel (Completed)   Vitamin D deficiency disease    Not taking D  Enc her to start 2000 iu daily  Level today  Imp for bone and overall health      Relevant Orders   VITAMIN D 25 Hydroxy (Vit-D Deficiency, Fractures) (Completed)    Other Visit Diagnoses    Need for influenza vaccination       Relevant Orders   Flu Vaccine QUAD 36+ mos IM (Completed)

## 2016-01-23 NOTE — Assessment & Plan Note (Signed)
A1C today Wt gain noted  ? How habits are Disc imp of low glycemic diet and wt loss to prevent DM

## 2016-01-28 LAB — CYTOLOGY - PAP
DIAGNOSIS: NEGATIVE
HPV (WINDOPATH): NOT DETECTED

## 2019-05-17 ENCOUNTER — Ambulatory Visit: Payer: BC Managed Care – PPO | Attending: Internal Medicine

## 2019-05-17 DIAGNOSIS — Z23 Encounter for immunization: Secondary | ICD-10-CM

## 2019-05-17 NOTE — Progress Notes (Signed)
   Covid-19 Vaccination Clinic  Name:  Geneve Kimpel    MRN: [id number] DOB: [birth year]  05/17/2019  Ms. Thilges was observed post Covid-19 immunization for 15 minutes without incidence. She was provided with Vaccine Information Sheet and instruction to access the V-Safe system.   Ms. Sytsma was instructed to call 911 with any severe reactions post vaccine: Marland Kitchen Difficulty breathing  . Swelling of your face and throat  . A fast heartbeat  . A bad rash all over your body  . Dizziness and weakness    Immunizations Administered    Name Date Dose VIS Date Route   Pfizer COVID-19 Vaccine 05/17/2019  9:12 AM 0.3 mL 03/02/2019 Intramuscular   Manufacturer: [employer], [employer]   Lot: [id number]   NDC: [address]-4

## 2019-06-13 ENCOUNTER — Ambulatory Visit: Payer: BC Managed Care – PPO | Attending: Internal Medicine

## 2019-06-13 DIAGNOSIS — Z23 Encounter for immunization: Secondary | ICD-10-CM

## 2019-06-13 NOTE — Progress Notes (Signed)
   Covid-19 Vaccination Clinic  Name:  Sara Glover    MRN: [id number] DOB: 05/13/1955  06/13/2019  Sara Glover was observed post Covid-19 immunization for 15 minutes without incident. She was provided with Vaccine Information Sheet and instruction to access the V-Safe system.   Sara Glover was instructed to call 911 with any severe reactions post vaccine: Marland Kitchen Difficulty breathing  . Swelling of face and throat  . A fast heartbeat  . A bad rash all over body  . Dizziness and weakness   Immunizations Administered    Name Date Dose VIS Date Route   Pfizer COVID-19 Vaccine 06/13/2019  8:12 AM 0.3 mL 03/02/2019 Intramuscular   Manufacturer: [employer], [employer]   Lot: [id number]   NDC: [address]-2

## 2021-05-04 NOTE — Progress Notes (Addendum)
[facility] - Clinic Note  05/05/2021     CHIEF COMPLAINT Patient presents for Retina Evaluation   HISTORY OF PRESENT ILLNESS: Sara Glover is a 66 y.o. female who presents to the clinic today for:   HPI     Retina Evaluation   In right eye.  I, the attending physician,  performed the HPI with the patient and updated documentation appropriately.        Comments   Patient here for Retina Evaluation. Referred by Dr Clydene PughWoodard. Patient states vision is ok. Noticed going down hill. Went to see Dr Clydene PughWoodard. Doctor saw broken blood vessel in OD. No eye pain. Patient not on any medicine or drops.       Last edited by Rennis ChrisZamora, Lamonta Cypress, MD on 05/06/2021  8:57 PM.    Pt is here on the referral of Dr. Clydene PughWoodard for concern of macular edema OD, pt states she feels like her vision has decreased over the last week, pt states she has not been to the dr in over 5 years, she states she had gastric bypass about 7 years ago, prior to that she was pre-diabetic and had elevated blood pressure, she has since been taken off all medications  Referring physician: Isla PenceWoodard, D Reid, OD [address] [place],  [place] [id number]  HISTORICAL INFORMATION:   Selected notes from the MEDICAL RECORD NUMBER Referred by Dr. Clydene PughWoodard for concern of BRVO LEE:  Ocular Hx- PMH-    CURRENT MEDICATIONS: No current outpatient medications on file. (Ophthalmic Drugs)   No current facility-administered medications for this visit. (Ophthalmic Drugs)   No current outpatient medications on file. (Other)   No current facility-administered medications for this visit. (Other)   REVIEW OF SYSTEMS: ROS   Positive for: Eyes Last edited by Laddie Aquaslarke, Rebecca S, COA on 05/05/2021  1:21 PM.     ALLERGIES Allergies  Allergen Reactions   Metformin     REACTION: diarrhea   PAST MEDICAL HISTORY Past Medical History:  Diagnosis Date   Diabetes mellitus 2004   type II   Hyperlipidemia 2004    Hypertension 2004   Morbid obesity (HCC)    Sleep apnea    Past Surgical History:  Procedure Laterality Date   bariactric surgery     CESAREAN SECTION     CHOLECYSTECTOMY     FAMILY HISTORY Family History  Problem Relation Age of Onset   Diabetes Mother    Cancer Mother        colon CA   Schizophrenia Mother    Diabetes Father    Cancer Sister        ovarian CA in 8240s   Diabetes Brother    SOCIAL HISTORY Social History   Tobacco Use   Smoking status: Never   Smokeless tobacco: Never  Vaping Use   Vaping Use: Never used  Substance Use Topics   Alcohol use: No   Drug use: No       OPHTHALMIC EXAM:  Base Eye Exam     Visual Acuity (Snellen - Linear)       Right Left   Dist cc 20/50 +1 20/20   Dist ph cc 20/40     Correction: Glasses         Tonometry (Tonopen, 1:17 PM)       Right Left   Pressure 15 13         Pupils       Dark Light Shape React APD  Right 3 2 Round Brisk None   Left 3 2 Round Brisk None         Visual Fields (Counting fingers)       Left Right    Full Full         Extraocular Movement       Right Left    Full, Ortho Full, Ortho         Neuro/Psych     Oriented x3: Yes   Mood/Affect: Normal         Dilation     Both eyes: 1.0% Mydriacyl, 2.5% Phenylephrine @ 1:16 PM           Slit Lamp and Fundus Exam     Slit Lamp Exam       Right Left   Lids/Lashes Dermatochalasis - upper lid, mild MGD Dermatochalasis - upper lid, mild MGD   Conjunctiva/Sclera White and quiet Mild temporal pinguecula   Cornea Mild tear film debris Mild tear film debris   Anterior Chamber Deep and quiet Deep and quiet   Iris Round and dilated Round and dilated   Lens 2+ Nuclear sclerosis, 2+ Cortical cataract 2+ Nuclear sclerosis, 2+ Cortical cataract   Anterior Vitreous Mild Vitreous syneresis Mild Vitreous syneresis, Posterior vitreous detachment, vitreous condensations         Fundus Exam       Right Left   Disc  Pink and Sharp Pink and Sharp, focal PPP temporal   C/D Ratio 0.2 0.3   Macula Blunted foveal reflex, cluster of flame hemes and DBH inferior macula -- BRVO with edema Flat, Blunted foveal reflex, RPE mottling, No heme or edema   Vessels attenuated, Tortuous, IT BRVO attenuated, Tortuous   Periphery Attached, focal cluster of drusen at 0200 equator, No heme, No RT/RD Attached, pigmented lattice at 0100 with retinal holes           Refraction     Wearing Rx       Sphere Cylinder Axis Add   Right -1.75 +0.75 008 +1.75   Left -2.25 +0.50 137 +1.75         Manifest Refraction       Sphere Cylinder Axis Dist VA   Right -2.50 +0.75 008 20/40   Left -2.25 +0.50 135 20/20           IMAGING AND PROCEDURES  Imaging and Procedures for 05/05/2021  OCT, Retina - OU - Both Eyes       Right Eye Quality was good. Central Foveal Thickness: 275. Progression has no prior data. Findings include abnormal foveal contour, retinal drusen , vitreomacular adhesion , intraretinal hyper-reflective material, no SRF, intraretinal fluid.   Left Eye Quality was good. Central Foveal Thickness: 275. Progression has no prior data. Findings include normal foveal contour, no IRF, no SRF, retinal drusen (Focal cluster of drusen ST macula).   Notes *Images captured and stored on drive  Diagnosis / Impression:  OD: inferior BRVO with CME OS: non-exu ARMD   Clinical management:  See below  Abbreviations: NFP - Normal foveal profile. CME - cystoid macular edema. PED - pigment epithelial detachment. IRF - intraretinal fluid. SRF - subretinal fluid. EZ - ellipsoid zone. ERM - epiretinal membrane. ORA - outer retinal atrophy. ORT - outer retinal tubulation. SRHM - subretinal hyper-reflective material. IRHM - intraretinal hyper-reflective material      Intravitreal Injection, Pharmacologic Agent - OD - Right Eye       Time Out 05/05/2021. 1:57 PM. Confirmed correct  patient, procedure, site, and  patient consented.   Anesthesia Topical anesthesia was used. Anesthetic medications included Lidocaine 2%, Proparacaine 0.5%.   Procedure Preparation included 5% betadine to ocular surface, eyelid speculum. A (32g) needle was used.   Injection: 1.25 mg Bevacizumab 1.25mg /0.69ml   Route: Intravitreal, Site: Right Eye   NDC: [id number], Lot: [id number], Expiration date: 06/01/2021   Post-op Post injection exam found visual acuity of at least counting fingers. The patient tolerated the procedure well. There were no complications. The patient received written and verbal post procedure care education.            ASSESSMENT/PLAN:    ICD-10-CM   1. Branch retinal vein occlusion of right eye with macular edema  H34.8310 OCT, Retina - OU - Both Eyes    Intravitreal Injection, Pharmacologic Agent - OD - Right Eye    Bevacizumab (AVASTIN) SOLN 1.25 mg    2. Lattice degeneration of left retina  H35.412     3. Intermediate stage nonexudative age-related macular degeneration of both eyes  H35.3132     4. Combined forms of age-related cataract of both eyes  H25.813      1. BRVO w/ CME OD - The natural history of retinal vein occlusion and macular edema and treatment options including observation, laser photocoagulation, and intravitreal antiVEGF injection with Avastin and Lucentis and Eylea and intravitreal injection of steroids with triamcinolone and Ozurdex and the complications of these procedures including loss of vision, infection, cataract, glaucoma, and retinal detachment were discussed with patient. - Specifically discussed findings from CRUISE / BRAVO study regarding patient stabilization with anti-VEGF agents and increased potential for visual improvements.  Also discussed need for frequent follow up and potentially multiple injections given the chronic nature of the disease process - BCVA 20/40 - OCT shows inferior BRVO with CME - recommend IVA OD #1 today, 02.14.23 - RBA of  procedure discussed, questions answered - informed consent obtained and signed - see procedure note - F/U 4 weeks -- DFE/OCT/possible injection  2. Lattice degeneration w/ atrophic holes, left eye - pigmented lattice at 0100 with retinal holes - discussed findings, prognosis, and treatment options including observation - recommend laser retinopexy OS - pt will come back next Tuesday at 2:45 for laser  3. Age related macular degeneration, non-exudative, both eyes  - The incidence, anatomy, and pathology of dry AMD, risk of progression, and the AREDS and AREDS 2 study including smoking risks discussed with patient.  - Recommend amsler grid monitoring  - f/u 3 months  4. Mixed Cataract OU - The symptoms of cataract, surgical options, and treatments and risks were discussed with patient. - discussed diagnosis and progression - not yet visually significant - monitor for now  Ophthalmic Meds Ordered this visit:  Meds ordered this encounter  Medications   Bevacizumab (AVASTIN) SOLN 1.25 mg     Return in about 1 week (around 05/12/2021) for f/u lattice degeneration OS, DFE, OCT.  There are no Patient Instructions on file for this visit.   Explained the diagnoses, plan, and follow up with the patient and they expressed understanding.  Patient expressed understanding of the importance of proper follow up care.   This document serves as a record of services personally performed by Karie Chimera, MD, PhD. It was created on their behalf by Glee Arvin. Manson Passey, OA an ophthalmic technician. The creation of this record is the provider's dictation and/or activities during the visit.    Electronically signed by: Glee Arvin. Manson Passey, OA  02.13.2023 9:14 PM   Karie Chimera, M.D., Ph.D. Diseases & Surgery of the Retina and Vitreous [facility]  I have reviewed the above documentation for accuracy and completeness, and I agree with the above. Karie Chimera, M.D., Ph.D.  05/06/21 9:14 PM  Abbreviations: M myopia (nearsighted); A astigmatism; H hyperopia (farsighted); P presbyopia; Mrx spectacle prescription;  CTL contact lenses; OD right eye; OS left eye; OU both eyes  XT exotropia; ET esotropia; PEK punctate epithelial keratitis; PEE punctate epithelial erosions; DES dry eye syndrome; MGD meibomian gland dysfunction; ATs artificial tears; PFAT's preservative free artificial tears; NSC nuclear sclerotic cataract; PSC posterior subcapsular cataract; ERM epi-retinal membrane; PVD posterior vitreous detachment; RD retinal detachment; DM diabetes mellitus; DR diabetic retinopathy; NPDR non-proliferative diabetic retinopathy; PDR proliferative diabetic retinopathy; CSME clinically significant macular edema; DME diabetic macular edema; dbh dot blot hemorrhages; CWS cotton wool spot; POAG primary open angle glaucoma; C/D cup-to-disc ratio; HVF humphrey visual field; GVF goldmann visual field; OCT optical coherence tomography; IOP intraocular pressure; BRVO Branch retinal vein occlusion; CRVO central retinal vein occlusion; CRAO central retinal artery occlusion; BRAO branch retinal artery occlusion; RT retinal tear; SB scleral buckle; PPV pars plana vitrectomy; VH Vitreous hemorrhage; PRP panretinal laser photocoagulation; IVK intravitreal kenalog; VMT vitreomacular traction; MH Macular hole;  NVD neovascularization of the disc; NVE neovascularization elsewhere; AREDS age related eye disease study; ARMD age related macular degeneration; POAG primary open angle glaucoma; EBMD epithelial/anterior basement membrane dystrophy; ACIOL anterior chamber intraocular lens; IOL intraocular lens; PCIOL posterior chamber intraocular lens; Phaco/IOL phacoemulsification with intraocular lens placement; PRK photorefractive keratectomy; LASIK laser assisted in situ keratomileusis; HTN hypertension; DM diabetes mellitus; COPD chronic obstructive pulmonary disease

## 2021-05-05 ENCOUNTER — Ambulatory Visit (INDEPENDENT_AMBULATORY_CARE_PROVIDER_SITE_OTHER): Payer: BC Managed Care – PPO | Admitting: Ophthalmology

## 2021-05-05 ENCOUNTER — Other Ambulatory Visit: Payer: Self-pay

## 2021-05-05 ENCOUNTER — Encounter (INDEPENDENT_AMBULATORY_CARE_PROVIDER_SITE_OTHER): Payer: Self-pay | Admitting: Ophthalmology

## 2021-05-05 DIAGNOSIS — H3581 Retinal edema: Secondary | ICD-10-CM

## 2021-05-05 DIAGNOSIS — H34831 Tributary (branch) retinal vein occlusion, right eye, with macular edema: Secondary | ICD-10-CM

## 2021-05-05 DIAGNOSIS — H353132 Nonexudative age-related macular degeneration, bilateral, intermediate dry stage: Secondary | ICD-10-CM | POA: Diagnosis not present

## 2021-05-05 DIAGNOSIS — H25813 Combined forms of age-related cataract, bilateral: Secondary | ICD-10-CM

## 2021-05-05 DIAGNOSIS — H35412 Lattice degeneration of retina, left eye: Secondary | ICD-10-CM

## 2021-05-06 ENCOUNTER — Encounter (INDEPENDENT_AMBULATORY_CARE_PROVIDER_SITE_OTHER): Payer: Self-pay | Admitting: Ophthalmology

## 2021-05-06 MED ORDER — BEVACIZUMAB CHEMO INJECTION 1.25MG/0.05ML SYRINGE FOR KALEIDOSCOPE
1.2500 mg | INTRAVITREAL | Status: AC | PRN
  Administered 2021-05-06: 1.25 mg via INTRAVITREAL

## 2021-05-11 NOTE — Progress Notes (Incomplete)
[facility] - Clinic Note  05/12/2021     CHIEF COMPLAINT Patient presents for No chief complaint on file.   HISTORY OF PRESENT ILLNESS: Sara Glover is a 66 y.o. female who presents to the clinic today for:    Pt is here on the referral of Dr. Clydene Pugh for concern of macular edema OD, pt states she feels like her vision has decreased over the last week, pt states she has not been to the dr in over 5 years, she states she had gastric bypass about 7 years ago, prior to that she was pre-diabetic and had elevated blood pressure, she has since been taken off all medications  Referring physician: Tower, Audrie Gallus, MD [address] [place],  [place] [address]  HISTORICAL INFORMATION:   Selected notes from the MEDICAL RECORD NUMBER Referred by Dr. Clydene Pugh for concern of BRVO LEE:  Ocular Hx- PMH-    CURRENT MEDICATIONS: No current outpatient medications on file. (Ophthalmic Drugs)   No current facility-administered medications for this visit. (Ophthalmic Drugs)   No current outpatient medications on file. (Other)   No current facility-administered medications for this visit. (Other)   REVIEW OF SYSTEMS:   ALLERGIES Allergies  Allergen Reactions   Metformin     REACTION: diarrhea   PAST MEDICAL HISTORY Past Medical History:  Diagnosis Date   Diabetes mellitus 2004   type II   Hyperlipidemia 2004   Hypertension 2004   Morbid obesity (HCC)    Sleep apnea    Past Surgical History:  Procedure Laterality Date   bariactric surgery     CESAREAN SECTION     CHOLECYSTECTOMY     FAMILY HISTORY Family History  Problem Relation Age of Onset   Diabetes Mother    Cancer Mother        colon CA   Schizophrenia Mother    Diabetes Father    Cancer Sister        ovarian CA in 65s   Diabetes Brother    SOCIAL HISTORY Social History   Tobacco Use   Smoking status: Never   Smokeless tobacco: Never  Vaping Use   Vaping Use: Never used   Substance Use Topics   Alcohol use: No   Drug use: No       OPHTHALMIC EXAM:  Not recorded    IMAGING AND PROCEDURES  Imaging and Procedures for 05/12/2021          ASSESSMENT/PLAN:    ICD-10-CM   1. Branch retinal vein occlusion of right eye with macular edema  H34.8310     2. Lattice degeneration of left retina  H35.412     3. Intermediate stage nonexudative age-related macular degeneration of both eyes  H35.3132     4. Combined forms of age-related cataract of both eyes  H25.813      1. BRVO w/ CME OD - The natural history of retinal vein occlusion and macular edema and treatment options including observation, laser photocoagulation, and intravitreal antiVEGF injection with Avastin and Lucentis and Eylea and intravitreal injection of steroids with triamcinolone and Ozurdex and the complications of these procedures including loss of vision, infection, cataract, glaucoma, and retinal detachment were discussed with patient. - Specifically discussed findings from CRUISE / BRAVO study regarding patient stabilization with anti-VEGF agents and increased potential for visual improvements.  Also discussed need for frequent follow up and potentially multiple injections given the chronic nature of the disease process - BCVA 20/40 -  OCT shows inferior BRVO with CME - recommend IVA OD #1 today, 02.14.23 - RBA of procedure discussed, questions answered - informed consent obtained and signed - see procedure note - F/U 4 weeks -- DFE/OCT/possible injection  2. Lattice degeneration w/ atrophic holes, left eye - pigmented lattice at 0100 with retinal holes - discussed findings, prognosis, and treatment options including observation - recommend laser retinopexy OS - patient will come back next Tuesday at 2:45 for laser  3. Age related macular degeneration, non-exudative, both eyes  - The incidence, anatomy, and pathology of dry AMD, risk of progression, and the AREDS and AREDS 2  study including smoking risks discussed with patient.  - Recommend amsler grid monitoring  - f/u 3 months  4. Mixed Cataract OU - The symptoms of cataract, surgical options, and treatments and risks were discussed with patient. - discussed diagnosis and progression - not yet visually significant  - monitor for now  Ophthalmic Meds Ordered this visit:  No orders of the defined types were placed in this encounter.    No follow-ups on file.  There are no Patient Instructions on file for this visit.   Explained the diagnoses, plan, and follow up with the patient and they expressed understanding.  Patient expressed understanding of the importance of proper follow up care.   This document serves as a record of services personally performed by Karie Chimera, MD, PhD. It was created on their behalf by Annalee Genta, COMT. The creation of this record is the provider's dictation and/or activities during the visit.  Electronically signed by: Annalee Genta, COMT 05/11/21 10:19 AM     Karie Chimera, M.D., Ph.D. Diseases & Surgery of the Retina and Vitreous [facility]    Abbreviations: M myopia (nearsighted); A astigmatism; H hyperopia (farsighted); P presbyopia; Mrx spectacle prescription;  CTL contact lenses; OD right eye; OS left eye; OU both eyes  XT exotropia; ET esotropia; PEK punctate epithelial keratitis; PEE punctate epithelial erosions; DES dry eye syndrome; MGD meibomian gland dysfunction; ATs artificial tears; PFAT's preservative free artificial tears; NSC nuclear sclerotic cataract; PSC posterior subcapsular cataract; ERM epi-retinal membrane; PVD posterior vitreous detachment; RD retinal detachment; DM diabetes mellitus; DR diabetic retinopathy; NPDR non-proliferative diabetic retinopathy; PDR proliferative diabetic retinopathy; CSME clinically significant macular edema; DME diabetic macular edema; dbh dot blot hemorrhages; CWS cotton wool spot; POAG  primary open angle glaucoma; C/D cup-to-disc ratio; HVF humphrey visual field; GVF goldmann visual field; OCT optical coherence tomography; IOP intraocular pressure; BRVO Branch retinal vein occlusion; CRVO central retinal vein occlusion; CRAO central retinal artery occlusion; BRAO branch retinal artery occlusion; RT retinal tear; SB scleral buckle; PPV pars plana vitrectomy; VH Vitreous hemorrhage; PRP panretinal laser photocoagulation; IVK intravitreal kenalog; VMT vitreomacular traction; MH Macular hole;  NVD neovascularization of the disc; NVE neovascularization elsewhere; AREDS age related eye disease study; ARMD age related macular degeneration; POAG primary open angle glaucoma; EBMD epithelial/anterior basement membrane dystrophy; ACIOL anterior chamber intraocular lens; IOL intraocular lens; PCIOL posterior chamber intraocular lens; Phaco/IOL phacoemulsification with intraocular lens placement; PRK photorefractive keratectomy; LASIK laser assisted in situ keratomileusis; HTN hypertension; DM diabetes mellitus; COPD chronic obstructive pulmonary disease

## 2021-05-12 ENCOUNTER — Encounter (INDEPENDENT_AMBULATORY_CARE_PROVIDER_SITE_OTHER): Payer: Self-pay | Admitting: Ophthalmology

## 2021-05-12 ENCOUNTER — Ambulatory Visit (INDEPENDENT_AMBULATORY_CARE_PROVIDER_SITE_OTHER): Payer: BC Managed Care – PPO | Admitting: Ophthalmology

## 2021-05-12 ENCOUNTER — Other Ambulatory Visit: Payer: Self-pay

## 2021-05-12 DIAGNOSIS — H34831 Tributary (branch) retinal vein occlusion, right eye, with macular edema: Secondary | ICD-10-CM

## 2021-05-12 DIAGNOSIS — H35412 Lattice degeneration of retina, left eye: Secondary | ICD-10-CM

## 2021-05-12 DIAGNOSIS — H353132 Nonexudative age-related macular degeneration, bilateral, intermediate dry stage: Secondary | ICD-10-CM

## 2021-05-12 DIAGNOSIS — H25813 Combined forms of age-related cataract, bilateral: Secondary | ICD-10-CM

## 2021-05-12 DIAGNOSIS — H33302 Unspecified retinal break, left eye: Secondary | ICD-10-CM

## 2021-05-12 NOTE — Progress Notes (Signed)
[facility] - Clinic Note  05/12/2021     CHIEF COMPLAINT Patient presents for Retina Follow Up   HISTORY OF PRESENT ILLNESS: Sara Glover is a 66 y.o. female who presents to the clinic today for:   HPI     Retina Follow Up   Patient presents with  Retinal Break/Detachment.  In left eye.  I, the attending physician,  performed the HPI with the patient and updated documentation appropriately.        Comments   1 week follow up lattice degeneration OS-  Retinopexy laser today OS. The first couple of days after the injection her vision seemed sharper.  On day 3 it wasn't as clear.  This week has been better.  She thinks it maybe is coming from being on the computer for long periods.       Last edited by Rennis Chris, MD on 05/13/2021 12:17 AM.    Pt here for laser retinopexy OS   Referring physician: Tower, Audrie Gallus, MD [address] [place],  [place] [address]  HISTORICAL INFORMATION:   Selected notes from the MEDICAL RECORD NUMBER Referred by Dr. Clydene Pugh for concern of BRVO LEE:  Ocular Hx- PMH-    CURRENT MEDICATIONS: No current outpatient medications on file. (Ophthalmic Drugs)   No current facility-administered medications for this visit. (Ophthalmic Drugs)   No current outpatient medications on file. (Other)   No current facility-administered medications for this visit. (Other)   REVIEW OF SYSTEMS: ROS   Positive for: Cardiovascular, Eyes, Respiratory Negative for: Constitutional, Gastrointestinal, Neurological, Skin, Genitourinary, Musculoskeletal, HENT, Endocrine, Psychiatric, Allergic/Imm, Heme/Lymph Last edited by Joni Reining, COA on 05/12/2021  2:35 PM.      ALLERGIES Allergies  Allergen Reactions   Metformin     REACTION: diarrhea   PAST MEDICAL HISTORY Past Medical History:  Diagnosis Date   Diabetes mellitus 2004   type II   Hyperlipidemia 2004   Hypertension 2004   Morbid obesity (HCC)    Sleep  apnea    Past Surgical History:  Procedure Laterality Date   bariactric surgery     CESAREAN SECTION     CHOLECYSTECTOMY     FAMILY HISTORY Family History  Problem Relation Age of Onset   Diabetes Mother    Cancer Mother        colon CA   Schizophrenia Mother    Diabetes Father    Cancer Sister        ovarian CA in 25s   Diabetes Brother    SOCIAL HISTORY Social History   Tobacco Use   Smoking status: Never   Smokeless tobacco: Never  Vaping Use   Vaping Use: Never used  Substance Use Topics   Alcohol use: No   Drug use: No       OPHTHALMIC EXAM:  Base Eye Exam     Visual Acuity (Snellen - Linear)       Right Left   Dist cc 20/50 20/25 -2   Dist ph cc 20/40 +2 20/25 +2         Tonometry (Tonopen, 2:41 PM)       Right Left   Pressure 17 17         Pupils       Dark Light Shape React APD   Right 3 2 Round Brisk None   Left 3 2 Round Brisk None         Visual Fields (Counting fingers)  Left Right    Full Full         Extraocular Movement       Right Left    Full Full         Neuro/Psych     Oriented x3: Yes   Mood/Affect: Normal         Dilation     Both eyes: 1.0% Mydriacyl, 2.5% Phenylephrine @ 2:41 PM           Slit Lamp and Fundus Exam     Slit Lamp Exam       Right Left   Lids/Lashes Dermatochalasis - upper lid, mild MGD Dermatochalasis - upper lid, mild MGD   Conjunctiva/Sclera White and quiet Mild temporal pinguecula   Cornea Mild tear film debris Mild tear film debris   Anterior Chamber Deep and quiet Deep and quiet   Iris Round and dilated Round and dilated   Lens 2+ Nuclear sclerosis, 2+ Cortical cataract 2+ Nuclear sclerosis, 2+ Cortical cataract   Anterior Vitreous Mild Vitreous syneresis Mild Vitreous syneresis, Posterior vitreous detachment, vitreous condensations         Fundus Exam       Right Left   Disc Pink and Sharp Pink and Sharp, focal PPP temporal   C/D Ratio 0.2 0.3   Macula  Blunted foveal reflex, cluster of flame hemes and DBH inferior macula -- BRVO with edema Flat, Blunted foveal reflex, RPE mottling, No heme or edema   Vessels attenuated, Tortuous, IT BRVO attenuated, Tortuous   Periphery Attached, focal cluster of drusen at 0200 equator, No heme, No RT/RD Attached, pigmented lattice at 0100 with retinal holes           Refraction     Wearing Rx       Sphere Cylinder Axis Add   Right -1.75 +0.75 008 +1.75   Left -2.25 +0.50 137 +1.75           IMAGING AND PROCEDURES  Imaging and Procedures for 05/12/2021  OCT, Retina - OU - Both Eyes       Right Eye Quality was good. Central Foveal Thickness: 271. Progression has improved. Findings include retinal drusen , vitreomacular adhesion , intraretinal hyper-reflective material, no SRF, normal foveal contour, no IRF (Interval improvement in IRF and edema inferior macula).   Left Eye Quality was good. Central Foveal Thickness: 243. Progression has been stable. Findings include normal foveal contour, no IRF, no SRF, retinal drusen (Focal cluster of drusen ST macula).   Notes *Images captured and stored on drive  Diagnosis / Impression:  OD: inferior BRVO with CME -- Interval improvement in IRF and edema inferior macula OS: non-exu ARMD   Clinical management:  See below  Abbreviations: NFP - Normal foveal profile. CME - cystoid macular edema. PED - pigment epithelial detachment. IRF - intraretinal fluid. SRF - subretinal fluid. EZ - ellipsoid zone. ERM - epiretinal membrane. ORA - outer retinal atrophy. ORT - outer retinal tubulation. SRHM - subretinal hyper-reflective material. IRHM - intraretinal hyper-reflective material      Repair Retinal Breaks, Laser - OS - Left Eye       LASER PROCEDURE NOTE  Procedure:  Barrier laser retinopexy using slit lamp laser, LEFT eye   Diagnosis:   Lattice degeneration w/ atrophic holes, LEFT eye                     Pigmented lattice at  1230  Surgeon: Rennis Chris, MD, PhD  Anesthesia: Topical  Informed consent obtained, operative eye marked, and time out performed prior to initiation of laser.   Laser settings:  Lumenis [id number] laser, slit lamp Lens: Mainster PRP 165 Power: 300 mW Spot size: 200 microns Duration: 30 msec  # spots: 346  Placement of laser: Using a Mainster PRP 165 contact lens at the slit lamp, laser was placed in three confluent rows around pigmented lattice w/ atrophic holes at 1230 oclock anterior to equator.  Complications: None.  Patient tolerated the procedure well and received written and verbal post-procedure care information/education.            ASSESSMENT/PLAN:    ICD-10-CM   1. Branch retinal vein occlusion of right eye with macular edema  H34.8310 OCT, Retina - OU - Both Eyes    2. Lattice degeneration of left retina  H35.412 Repair Retinal Breaks, Laser - OS - Left Eye    3. Left retinal defect  H33.302 Repair Retinal Breaks, Laser - OS - Left Eye    4. Intermediate stage nonexudative age-related macular degeneration of both eyes  H35.3132     5. Combined forms of age-related cataract of both eyes  H25.813      1. BRVO w/ CME OD  - s/p IVA OD #1 (02.14.23) - BCVA 20/40 - OCT shows Interval improvement in IRF and edema inferior macula - F/U 3 weeks -- DFE/OCT/possible injection  2,3. Lattice degeneration w/ atrophic holes, left eye - pigmented lattice at 1230 with retinal holes - recommend laser retinopexy OS today, 02.21.23 - pt wishes to proceed with laser - RBA of procedure discussed, questions answered - informed consent obtained and signed - see procedure note - start Lotemax QID OS x5-7 days - f/u 3 weeks  4. Age related macular degeneration, non-exudative, both eyes  - The incidence, anatomy, and pathology of dry AMD, risk of progression, and the AREDS and AREDS 2 study including smoking risks discussed with patient.  - Recommend amsler grid  monitoring  5. Mixed Cataract OU - The symptoms of cataract, surgical options, and treatments and risks were discussed with patient. - discussed diagnosis and progression  - monitor  Ophthalmic Meds Ordered this visit:  No orders of the defined types were placed in this encounter.    Return in about 3 weeks (around 06/02/2021) for f/u BRVO OD, DFE, OCT.  There are no Patient Instructions on file for this visit.   Explained the diagnoses, plan, and follow up with the patient and they expressed understanding.  Patient expressed understanding of the importance of proper follow up care.   This document serves as a record of services personally performed by Karie ChimeraBrian G. Yarnell Kozloski, MD, PhD. It was created on their behalf by Annalee Gentaaryl Barber, COMT. The creation of this record is the provider's dictation and/or activities during the visit.  Electronically signed by: Annalee Gentaaryl Barber, COMT 05/13/21 12:26 AM  Karie ChimeraBrian G. Tapanga Ottaway, M.D., Ph.D. Diseases & Surgery of the Retina and Vitreous [facility]  I have reviewed the above documentation for accuracy and completeness, and I agree with the above. Karie ChimeraBrian G. Jevaeh Shams, M.D., Ph.D. 05/13/21 12:26 AM  Abbreviations: M myopia (nearsighted); A astigmatism; H hyperopia (farsighted); P presbyopia; Mrx spectacle prescription;  CTL contact lenses; OD right eye; OS left eye; OU both eyes  XT exotropia; ET esotropia; PEK punctate epithelial keratitis; PEE punctate epithelial erosions; DES dry eye syndrome; MGD meibomian gland dysfunction; ATs artificial tears; PFAT's preservative free artificial tears; NSC nuclear sclerotic cataract; PSC posterior  subcapsular cataract; ERM epi-retinal membrane; PVD posterior vitreous detachment; RD retinal detachment; DM diabetes mellitus; DR diabetic retinopathy; NPDR non-proliferative diabetic retinopathy; PDR proliferative diabetic retinopathy; CSME clinically significant macular edema; DME diabetic macular edema; dbh dot  blot hemorrhages; CWS cotton wool spot; POAG primary open angle glaucoma; C/D cup-to-disc ratio; HVF humphrey visual field; GVF goldmann visual field; OCT optical coherence tomography; IOP intraocular pressure; BRVO Branch retinal vein occlusion; CRVO central retinal vein occlusion; CRAO central retinal artery occlusion; BRAO branch retinal artery occlusion; RT retinal tear; SB scleral buckle; PPV pars plana vitrectomy; VH Vitreous hemorrhage; PRP panretinal laser photocoagulation; IVK intravitreal kenalog; VMT vitreomacular traction; MH Macular hole;  NVD neovascularization of the disc; NVE neovascularization elsewhere; AREDS age related eye disease study; ARMD age related macular degeneration; POAG primary open angle glaucoma; EBMD epithelial/anterior basement membrane dystrophy; ACIOL anterior chamber intraocular lens; IOL intraocular lens; PCIOL posterior chamber intraocular lens; Phaco/IOL phacoemulsification with intraocular lens placement; PRK photorefractive keratectomy; LASIK laser assisted in situ keratomileusis; HTN hypertension; DM diabetes mellitus; COPD chronic obstructive pulmonary disease

## 2021-05-13 ENCOUNTER — Encounter (INDEPENDENT_AMBULATORY_CARE_PROVIDER_SITE_OTHER): Payer: Self-pay | Admitting: Ophthalmology

## 2021-05-25 NOTE — Progress Notes (Shared)
[facility] - Clinic Note  06/02/2021     CHIEF COMPLAINT Patient presents for No chief complaint on file.   HISTORY OF PRESENT ILLNESS: Sara Glover is a 66 y.o. female who presents to the clinic today for:    Pt here for laser retinopexy OS   Referring physician: Tower, Audrie Gallus, MD [address] [place],  [place] [address]  HISTORICAL INFORMATION:   Selected notes from the MEDICAL RECORD NUMBER Referred by Dr. Clydene Pugh for concern of BRVO LEE:  Ocular Hx- PMH-    CURRENT MEDICATIONS: No current outpatient medications on file. (Ophthalmic Drugs)   No current facility-administered medications for this visit. (Ophthalmic Drugs)   No current outpatient medications on file. (Other)   No current facility-administered medications for this visit. (Other)   REVIEW OF SYSTEMS:    ALLERGIES Allergies  Allergen Reactions   Metformin     REACTION: diarrhea   PAST MEDICAL HISTORY Past Medical History:  Diagnosis Date   Diabetes mellitus 2004   type II   Hyperlipidemia 2004   Hypertension 2004   Morbid obesity (HCC)    Sleep apnea    Past Surgical History:  Procedure Laterality Date   bariactric surgery     CESAREAN SECTION     CHOLECYSTECTOMY     FAMILY HISTORY Family History  Problem Relation Age of Onset   Diabetes Mother    Cancer Mother        colon CA   Schizophrenia Mother    Diabetes Father    Cancer Sister        ovarian CA in 88s   Diabetes Brother    SOCIAL HISTORY Social History   Tobacco Use   Smoking status: Never   Smokeless tobacco: Never  Vaping Use   Vaping Use: Never used  Substance Use Topics   Alcohol use: No   Drug use: No       OPHTHALMIC EXAM:  Not recorded    IMAGING AND PROCEDURES  Imaging and Procedures for 06/02/2021          ASSESSMENT/PLAN:    ICD-10-CM   1. Branch retinal vein occlusion of right eye with macular edema  H34.8310     2. Lattice degeneration of  left retina  H35.412     3. Left retinal defect  H33.302     4. Intermediate stage nonexudative age-related macular degeneration of both eyes  H35.3132     5. Combined forms of age-related cataract of both eyes  H25.813       1. BRVO w/ CME OD  - s/p IVA OD #1 (02.14.23) - BCVA 20/40 - OCT shows Interval improvement in IRF and edema inferior macula  - recommend IVA OD #2 today, 03.14.23 - F/U 3 weeks -- DFE/OCT/possible injection  2,3. Lattice degeneration w/ atrophic holes, left eye - pigmented lattice at 1230 with retinal holes - recommend laser retinopexy OS today, 02.21.23 - pt wishes to proceed with laser - RBA of procedure discussed, questions answered  - informed consent obtained and signed - see procedure note - start Lotemax QID OS x5-7 days - f/u 3 weeks  4. Age related macular degeneration, non-exudative, both eyes  - The incidence, anatomy, and pathology of dry AMD, risk of progression, and the AREDS and AREDS 2 study including smoking risks discussed with patient.   - Recommend amsler grid monitoring  5. Mixed Cataract OU - The symptoms of cataract, surgical options, and treatments and  risks were discussed with patient. - discussed diagnosis and progression  - monitor   Ophthalmic Meds Ordered this visit:  No orders of the defined types were placed in this encounter.    No follow-ups on file.  There are no Patient Instructions on file for this visit.   Explained the diagnoses, plan, and follow up with the patient and they expressed understanding.  Patient expressed understanding of the importance of proper follow up care.   This document serves as a record of services personally performed by Karie Chimera, MD, PhD. It was created on their behalf by Joni Reining, an ophthalmic technician. The creation of this record is the provider's dictation and/or activities during the visit.    Electronically signed by: Joni Reining COA, 05/25/21  2:01 PM   Karie Chimera, M.D., Ph.D. Diseases & Surgery of the Retina and Vitreous [facility]   Abbreviations: M myopia (nearsighted); A astigmatism; H hyperopia (farsighted); P presbyopia; Mrx spectacle prescription;  CTL contact lenses; OD right eye; OS left eye; OU both eyes  XT exotropia; ET esotropia; PEK punctate epithelial keratitis; PEE punctate epithelial erosions; DES dry eye syndrome; MGD meibomian gland dysfunction; ATs artificial tears; PFAT's preservative free artificial tears; NSC nuclear sclerotic cataract; PSC posterior subcapsular cataract; ERM epi-retinal membrane; PVD posterior vitreous detachment; RD retinal detachment; DM diabetes mellitus; DR diabetic retinopathy; NPDR non-proliferative diabetic retinopathy; PDR proliferative diabetic retinopathy; CSME clinically significant macular edema; DME diabetic macular edema; dbh dot blot hemorrhages; CWS cotton wool spot; POAG primary open angle glaucoma; C/D cup-to-disc ratio; HVF humphrey visual field; GVF goldmann visual field; OCT optical coherence tomography; IOP intraocular pressure; BRVO Branch retinal vein occlusion; CRVO central retinal vein occlusion; CRAO central retinal artery occlusion; BRAO branch retinal artery occlusion; RT retinal tear; SB scleral buckle; PPV pars plana vitrectomy; VH Vitreous hemorrhage; PRP panretinal laser photocoagulation; IVK intravitreal kenalog; VMT vitreomacular traction; MH Macular hole;  NVD neovascularization of the disc; NVE neovascularization elsewhere; AREDS age related eye disease study; ARMD age related macular degeneration; POAG primary open angle glaucoma; EBMD epithelial/anterior basement membrane dystrophy; ACIOL anterior chamber intraocular lens; IOL intraocular lens; PCIOL posterior chamber intraocular lens; Phaco/IOL phacoemulsification with intraocular lens placement; PRK photorefractive keratectomy; LASIK laser assisted in situ keratomileusis; HTN hypertension; DM diabetes  mellitus; COPD chronic obstructive pulmonary disease

## 2021-06-02 ENCOUNTER — Ambulatory Visit (INDEPENDENT_AMBULATORY_CARE_PROVIDER_SITE_OTHER): Payer: BC Managed Care – PPO | Admitting: Ophthalmology

## 2021-06-02 ENCOUNTER — Encounter (INDEPENDENT_AMBULATORY_CARE_PROVIDER_SITE_OTHER): Payer: Self-pay | Admitting: Ophthalmology

## 2021-06-02 ENCOUNTER — Other Ambulatory Visit: Payer: Self-pay

## 2021-06-02 DIAGNOSIS — H353132 Nonexudative age-related macular degeneration, bilateral, intermediate dry stage: Secondary | ICD-10-CM

## 2021-06-02 DIAGNOSIS — H35412 Lattice degeneration of retina, left eye: Secondary | ICD-10-CM

## 2021-06-02 DIAGNOSIS — H25813 Combined forms of age-related cataract, bilateral: Secondary | ICD-10-CM

## 2021-06-02 DIAGNOSIS — H33302 Unspecified retinal break, left eye: Secondary | ICD-10-CM

## 2021-06-02 DIAGNOSIS — H34831 Tributary (branch) retinal vein occlusion, right eye, with macular edema: Secondary | ICD-10-CM

## 2021-06-02 MED ORDER — BEVACIZUMAB CHEMO INJECTION 1.25MG/0.05ML SYRINGE FOR KALEIDOSCOPE
1.2500 mg | INTRAVITREAL | Status: AC | PRN
  Administered 2021-06-02: 1.25 mg via INTRAVITREAL

## 2021-06-26 NOTE — Progress Notes (Signed)
?[facility] Note ? ?06/30/2021 ? ?  ? ?CHIEF COMPLAINT ?Patient presents for Retina Follow Up ? ? ?HISTORY OF PRESENT ILLNESS: ?Sara Glover is a 66 y.o. female who presents to the clinic today for:  ? ?HPI   ? ? Retina Follow Up   ?Patient presents with  CRVO/BRVO.  In right eye.  This started 4 weeks ago.  I, the attending physician,  performed the HPI with the patient and updated documentation appropriately. ? ?  ?  ? ? Comments   ?Patient here for 4 weeks retina follow up for BRVO OD. Patient states vision doing pretty good. Seems to be a little better. No eye pain.  ? ?  ?  ?Last edited by Bernarda Caffey, MD on 06/30/2021  4:28 PM.  ?  ? ? ?Referring physician: ?Tower, Wynelle Fanny, MD ?[address] ?[place],  [place] [address] ? ?HISTORICAL INFORMATION:  ? ?Selected notes from the [facility] ?Referred by Dr. Ellin Mayhew for concern of BRVO ?LEE:  ?Ocular Hx- ?PMH- ?  ? ?CURRENT MEDICATIONS: ?No current outpatient medications on file. (Ophthalmic Drugs)  ? ?No current facility-administered medications for this visit. (Ophthalmic Drugs)  ? ?No current outpatient medications on file. (Other)  ? ?No current facility-administered medications for this visit. (Other)  ? ?REVIEW OF SYSTEMS: ?ROS   ?Positive for: Cardiovascular, Eyes, Respiratory ?Negative for: Constitutional, Gastrointestinal, Neurological, Skin, Genitourinary, Musculoskeletal, HENT, Endocrine, Psychiatric, Allergic/Imm, Heme/Lymph ?Last edited by Theodore Demark, COA on 06/30/2021  2:41 PM.  ?  ? ? ?ALLERGIES ?Allergies  ?Allergen Reactions  ? Metformin   ?  REACTION: diarrhea  ? ?PAST MEDICAL HISTORY ?Past Medical History:  ?Diagnosis Date  ? Diabetes mellitus 2004  ? type II  ? Hyperlipidemia 2004  ? Hypertension 2004  ? Morbid obesity ([place])   ? Sleep apnea   ? ?Past Surgical History:  ?Procedure Laterality Date  ? bariactric surgery    ? CESAREAN SECTION    ? CHOLECYSTECTOMY    ? ?FAMILY HISTORY ?Family  History  ?Problem Relation Age of Onset  ? Diabetes Mother   ? Cancer Mother   ?     colon CA  ? Schizophrenia Mother   ? Diabetes Father   ? Cancer Sister   ?     ovarian CA in 89s  ? Diabetes Brother   ? ?SOCIAL HISTORY ?Social History  ? ?Tobacco Use  ? Smoking status: Never  ? Smokeless tobacco: Never  ?Vaping Use  ? Vaping Use: Never used  ?Substance Use Topics  ? Alcohol use: No  ? Drug use: No  ?  ? ?  ?OPHTHALMIC EXAM: ? ?Base Eye Exam   ? ? Visual Acuity (Snellen - Linear)   ? ?   Right Left  ? Dist cc 20/40 20/25 +1  ? Dist ph cc 20/30 NI  ? ? Correction: Glasses  ? ?  ?  ? ? Tonometry (Tonopen, 2:39 PM)   ? ?   Right Left  ? Pressure 16 13  ? ?  ?  ? ? Pupils   ? ?   Dark Light Shape React APD  ? Right 3 2 Round Brisk None  ? Left 3 2 Round Brisk None  ? ?  ?  ? ? Visual Fields (Counting fingers)   ? ?   Left Right  ?  Full Full  ? ?  ?  ? ? Extraocular Movement   ? ?  Right Left  ?  Full, Ortho Full, Ortho  ? ?  ?  ? ? Neuro/Psych   ? ? Oriented x3: Yes  ? Mood/Affect: Normal  ? ?  ?  ? ? Dilation   ? ? Both eyes: 1.0% Mydriacyl, 2.5% Phenylephrine @ 2:38 PM  ? ?  ?  ? ?  ? ?Slit Lamp and Fundus Exam   ? ? Slit Lamp Exam   ? ?   Right Left  ? Lids/Lashes Dermatochalasis - upper lid, mild MGD Dermatochalasis - upper lid, mild MGD  ? Conjunctiva/Sclera White and quiet Mild temporal pinguecula  ? Cornea Mild tear film debris Mild tear film debris  ? Anterior Chamber Deep and quiet Deep and quiet  ? Iris Round and dilated Round and dilated  ? Lens 2+ Nuclear sclerosis, 2+ Cortical cataract 2+ Nuclear sclerosis, 2+ Cortical cataract  ? Anterior Vitreous Mild Vitreous syneresis Mild Vitreous syneresis, Posterior vitreous detachment, vitreous condensations  ? ?  ?  ? ? Fundus Exam   ? ?   Right Left  ? Disc Pink and Sharp Pink and Sharp, focal PPP temporal  ? C/D Ratio 0.2 0.3  ? Macula Blunted foveal reflex, cluster of flame hemes and DBH inferior macula -- BRVO with edema - improving Flat, Blunted foveal  reflex, RPE mottling, No heme or edema  ? Vessels attenuated, Tortuous, IT BRVO attenuated, Tortuous  ? Periphery Attached, focal cluster of drusen at 0200 equator, No heme, No RT/RD Attached, pigmented lattice at 0100 with retinal holes - good laser surrounding, no RT/RD/Lattice  ? ?  ?  ? ?  ? ?Refraction   ? ? Wearing Rx   ? ?   Sphere Cylinder Axis Add  ? Right -1.75 +0.75 008 +1.75  ? Left -2.25 +0.50 137 +1.75  ? ?  ?  ? ?  ? ?IMAGING AND PROCEDURES  ?Imaging and Procedures for 06/30/2021 ? ?OCT, Retina - OU - Both Eyes   ? ?   ?Right Eye ?Quality was good. Central Foveal Thickness: 264. Progression has improved. Findings include retinal drusen , vitreomacular adhesion , intraretinal hyper-reflective material, no SRF, normal foveal contour, intraretinal fluid (interval improvement in IRF and edema inferior macula, persistent IRHM).  ? ?Left Eye ?Quality was good. Central Foveal Thickness: 248. Progression has been stable. Findings include normal foveal contour, no IRF, no SRF, retinal drusen , myopic contour (Focal cluster of drusen ST macula).  ? ?Notes ?*Images captured and stored on drive ? ?Diagnosis / Impression:  ?OD: inferior BRVO with CME -- improvement in IRF and edema inferior macula, persistent IRHM ?OS: non-exu ARMD  ? ?Clinical management:  ?See below ? ?Abbreviations: NFP - Normal foveal profile. CME - cystoid macular edema. PED - pigment epithelial detachment. IRF - intraretinal fluid. SRF - subretinal fluid. EZ - ellipsoid zone. ERM - epiretinal membrane. ORA - outer retinal atrophy. ORT - outer retinal tubulation. SRHM - subretinal hyper-reflective material. IRHM - intraretinal hyper-reflective material ? ? ?  ? ?Intravitreal Injection, Pharmacologic Agent - OD - Right Eye   ? ?   ?Time Out ?06/30/2021. 2:55 PM. Confirmed correct patient, procedure, site, and patient consented.  ? ?Anesthesia ?Topical anesthesia was used. Anesthetic medications included Lidocaine 2%, Proparacaine 0.5%.   ? ?Procedure ?Preparation included 5% betadine to ocular surface, eyelid speculum. A (32g) needle was used.  ? ?Injection: ?1.25 mg Bevacizumab 1.68m/0.05ml ?  Route: Intravitreal, Site: Right Eye ?  NDC: 598119-147-82 Lot: [id number], Expiration date: 07/29/2021  ? ?  Post-op ?Post injection exam found visual acuity of at least counting fingers. The patient tolerated the procedure well. There were no complications. The patient received written and verbal post procedure care education.  ? ?  ?  ?  ? ?  ?ASSESSMENT/PLAN: ? ?  ICD-10-CM   ?1. Branch retinal vein occlusion of right eye with macular edema  H34.8310 OCT, Retina - OU - Both Eyes  ?  Intravitreal Injection, Pharmacologic Agent - OD - Right Eye  ?  Bevacizumab (AVASTIN) SOLN 1.25 mg  ?  ?2. Lattice degeneration of left retina  H35.412   ?  ?3. Left retinal defect  H33.302   ?  ?4. Intermediate stage nonexudative age-related macular degeneration of both eyes  H35.3132   ?  ?5. Combined forms of age-related cataract of both eyes  H25.813   ?  ? ?1. BRVO w/ CME OD ? - s/p IVA OD #1 (02.14.23), #2 (03.14.23) ?- BCVA 20/30 improved from 20/40 ?- OCT shows Interval improvement in IRF and edema inferior macula  ?- recommend IVA OD #3 today, 04.11.23 ?- pt wishes to proceed with IVA OD ?- RBA of procedure discussed, questions answered ?- informed consent obtained and signed ?- see procedure note  ?- F/U 4 weeks -- DFE/OCT/possible injection ? ?2,3. Lattice degeneration w/ atrophic holes, left eye ?- pigmented lattice at 1230 with retinal holes ?- s/p laser retinopexy OS (02.21.23) - good laser surrounding ?- monitor  ? ?4. Age related macular degeneration, non-exudative, both eyes ? - The incidence, anatomy, and pathology of dry AMD, risk of progression, and the AREDS and AREDS 2 study including smoking risks discussed with patient.  ? - Recommend amsler grid monitoring ? ?5. Mixed Cataract OU ?- The symptoms of cataract, surgical options, and treatments and risks  were discussed with patient. ?- discussed diagnosis and progression ? - monitor  ? ?Ophthalmic Meds Ordered this visit:  ?Meds ordered this encounter  ?Medications  ? Bevacizumab (AVASTIN) SOLN 1.25 mg  ?  ? ?Ret

## 2021-06-30 ENCOUNTER — Ambulatory Visit (INDEPENDENT_AMBULATORY_CARE_PROVIDER_SITE_OTHER): Payer: BC Managed Care – PPO | Admitting: Ophthalmology

## 2021-06-30 ENCOUNTER — Encounter (INDEPENDENT_AMBULATORY_CARE_PROVIDER_SITE_OTHER): Payer: Self-pay | Admitting: Ophthalmology

## 2021-06-30 DIAGNOSIS — H25813 Combined forms of age-related cataract, bilateral: Secondary | ICD-10-CM

## 2021-06-30 DIAGNOSIS — H35412 Lattice degeneration of retina, left eye: Secondary | ICD-10-CM | POA: Diagnosis not present

## 2021-06-30 DIAGNOSIS — H33302 Unspecified retinal break, left eye: Secondary | ICD-10-CM

## 2021-06-30 DIAGNOSIS — H353132 Nonexudative age-related macular degeneration, bilateral, intermediate dry stage: Secondary | ICD-10-CM | POA: Diagnosis not present

## 2021-06-30 DIAGNOSIS — H34831 Tributary (branch) retinal vein occlusion, right eye, with macular edema: Secondary | ICD-10-CM | POA: Diagnosis not present

## 2021-06-30 MED ORDER — BEVACIZUMAB CHEMO INJECTION 1.25MG/0.05ML SYRINGE FOR KALEIDOSCOPE
1.2500 mg | INTRAVITREAL | Status: AC | PRN
  Administered 2021-06-30: 1.25 mg via INTRAVITREAL

## 2021-07-15 NOTE — Progress Notes (Shared)
?[facility] Note ? ?07/28/2021 ? ?  ? ?CHIEF COMPLAINT ?Patient presents for No chief complaint on file. ? ? ?HISTORY OF PRESENT ILLNESS: ?Sara Glover is a 66 y.o. female who presents to the clinic today for:  ? ? ? ? ?Referring physician: ?Tower, Audrie Gallus, MD ?[address] [place] ?[place],  [place] [address] ? ?HISTORICAL INFORMATION:  ? ?Selected notes from the MEDICAL RECORD NUMBER ?Referred by Dr. Clydene Pugh for concern of BRVO ?LEE:  ?Ocular Hx- ?PMH- ?  ? ?CURRENT MEDICATIONS: ?No current outpatient medications on file. (Ophthalmic Drugs)  ? ?No current facility-administered medications for this visit. (Ophthalmic Drugs)  ? ?No current outpatient medications on file. (Other)  ? ?No current facility-administered medications for this visit. (Other)  ? ?REVIEW OF SYSTEMS: ? ? ? ?ALLERGIES ?Allergies  ?Allergen Reactions  ? Metformin   ?  REACTION: diarrhea  ? ?PAST MEDICAL HISTORY ?Past Medical History:  ?Diagnosis Date  ? Diabetes mellitus 2004  ? type II  ? Hyperlipidemia 2004  ? Hypertension 2004  ? Morbid obesity ([facility])   ? Sleep apnea   ? ?Past Surgical History:  ?Procedure Laterality Date  ? bariactric surgery    ? CESAREAN SECTION    ? CHOLECYSTECTOMY    ? ?FAMILY HISTORY ?Family History  ?Problem Relation Age of Onset  ? Diabetes Mother   ? Cancer Mother   ?     colon CA  ? Schizophrenia Mother   ? Diabetes Father   ? Cancer Sister   ?     ovarian CA in 40s  ? Diabetes Brother   ? ?SOCIAL HISTORY ?Social History  ? ?Tobacco Use  ? Smoking status: Never  ? Smokeless tobacco: Never  ?Vaping Use  ? Vaping Use: Never used  ?Substance Use Topics  ? Alcohol use: No  ? Drug use: No  ?  ? ?  ?OPHTHALMIC EXAM: ? ?Not recorded ?  ? ?IMAGING AND PROCEDURES  ?Imaging and Procedures for 07/28/2021 ? ? ?  ?  ? ?  ?ASSESSMENT/PLAN: ? ?  ICD-10-CM   ?1. Branch retinal vein occlusion of right eye with macular edema  H34.8310   ?  ?2. Lattice degeneration of left retina  H35.412   ?  ?3. Left  retinal defect  H33.302   ?  ?4. Intermediate stage nonexudative age-related macular degeneration of both eyes  H35.3132   ?  ?5. Combined forms of age-related cataract of both eyes  H25.813   ?  ? ? ?1. BRVO w/ CME OD ? - s/p IVA OD #1 (02.14.23), #2 (03.14.23), #3 (04.11.23) ?- BCVA 20/30 improved from 20/40 ?- OCT shows Interval improvement in IRF and edema inferior macula  ?- recommend IVA OD #4 today, 05.09.23 ?- pt wishes to proceed with IVA OD ?- RBA of procedure discussed, questions answered ?- informed consent obtained and signed ?- see procedure note  ?- F/U 4 weeks -- DFE/OCT/possible injection ? ?2,3. Lattice degeneration w/ atrophic holes, left eye ?- pigmented lattice at 1230 with retinal holes ?- s/p laser retinopexy OS (02.21.23) - good laser surrounding ?- montor ? ?4. Age related macular degeneration, non-exudative, both eyes ? - The incidence, anatomy, and pathology of dry AMD, risk of progression, and the AREDS and AREDS 2 study including smoking risks discussed with patient.  ? - recommended Amsler Grid monitoring  ? ?5. Mixed Cataract OU ?- The symptoms of cataract, surgical options, and treatments and risks were discussed  with patient. ?- discussed diagnosis and progression ? - monitor ? ?Ophthalmic Meds Ordered this visit:  ?No orders of the defined types were placed in this encounter. ?  ? ?No follow-ups on file. ? ?There are no Patient Instructions on file for this visit. ? ? ?Explained the diagnoses, plan, and follow up with the patient and they expressed understanding.  Patient expressed understanding of the importance of proper follow up care.  ? ?This document serves as a record of services personally performed by Karie Chimera, MD, PhD. It was created on their behalf by Julieanne Cotton, COT, an ophthalmic technician. The creation of this record is the provider's dictation and/or activities during the visit.   ? ?Electronically signed by: Julieanne Cotton, COT 07/15/21 10:42  AM ? ? ?Karie Chimera, M.D., Ph.D. ?Diseases & Surgery of the Retina and Vitreous ?[facility] ? ? ?Abbreviations: ?M myopia (nearsighted); A astigmatism; H hyperopia (farsighted); P presbyopia; Mrx spectacle prescription;  CTL contact lenses; OD right eye; OS left eye; OU both eyes  XT exotropia; ET esotropia; PEK punctate epithelial keratitis; PEE punctate epithelial erosions; DES dry eye syndrome; MGD meibomian gland dysfunction; ATs artificial tears; PFAT's preservative free artificial tears; NSC nuclear sclerotic cataract; PSC posterior subcapsular cataract; ERM epi-retinal membrane; PVD posterior vitreous detachment; RD retinal detachment; DM diabetes mellitus; DR diabetic retinopathy; NPDR non-proliferative diabetic retinopathy; PDR proliferative diabetic retinopathy; CSME clinically significant macular edema; DME diabetic macular edema; dbh dot blot hemorrhages; CWS cotton wool spot; POAG primary open angle glaucoma; C/D cup-to-disc ratio; HVF humphrey visual field; GVF goldmann visual field; OCT optical coherence tomography; IOP intraocular pressure; BRVO Branch retinal vein occlusion; CRVO central retinal vein occlusion; CRAO central retinal artery occlusion; BRAO branch retinal artery occlusion; RT retinal tear; SB scleral buckle; PPV pars plana vitrectomy; VH Vitreous hemorrhage; PRP panretinal laser photocoagulation; IVK intravitreal kenalog; VMT vitreomacular traction; MH Macular hole;  NVD neovascularization of the disc; NVE neovascularization elsewhere; AREDS age related eye disease study; ARMD age related macular degeneration; POAG primary open angle glaucoma; EBMD epithelial/anterior basement membrane dystrophy; ACIOL anterior chamber intraocular lens; IOL intraocular lens; PCIOL posterior chamber intraocular lens; Phaco/IOL phacoemulsification with intraocular lens placement; PRK photorefractive keratectomy; LASIK laser assisted in situ keratomileusis; HTN hypertension; DM  diabetes mellitus; COPD chronic obstructive pulmonary disease ? ?

## 2021-07-28 ENCOUNTER — Encounter (INDEPENDENT_AMBULATORY_CARE_PROVIDER_SITE_OTHER): Payer: BC Managed Care – PPO | Admitting: Ophthalmology

## 2021-07-28 DIAGNOSIS — H35412 Lattice degeneration of retina, left eye: Secondary | ICD-10-CM

## 2021-07-28 DIAGNOSIS — H34831 Tributary (branch) retinal vein occlusion, right eye, with macular edema: Secondary | ICD-10-CM

## 2021-07-28 DIAGNOSIS — H25813 Combined forms of age-related cataract, bilateral: Secondary | ICD-10-CM

## 2021-07-28 DIAGNOSIS — H353132 Nonexudative age-related macular degeneration, bilateral, intermediate dry stage: Secondary | ICD-10-CM

## 2021-07-28 DIAGNOSIS — H33302 Unspecified retinal break, left eye: Secondary | ICD-10-CM

## 2021-08-05 ENCOUNTER — Encounter (INDEPENDENT_AMBULATORY_CARE_PROVIDER_SITE_OTHER): Payer: BC Managed Care – PPO | Admitting: Ophthalmology

## 2021-08-07 ENCOUNTER — Encounter (INDEPENDENT_AMBULATORY_CARE_PROVIDER_SITE_OTHER): Payer: BC Managed Care – PPO | Admitting: Ophthalmology

## 2021-08-12 NOTE — Progress Notes (Signed)
[facility] - Clinic Note  08/14/2021     CHIEF COMPLAINT Patient presents for Retina Follow Up   HISTORY OF PRESENT ILLNESS: Sara Glover is a 66 y.o. female who presents to the clinic today for:   HPI     Retina Follow Up   Patient presents with  CRVO/BRVO.  In right eye.  Duration of 6 weeks.  Since onset it is stable.  I, the attending physician,  performed the HPI with the patient and updated documentation appropriately.        Comments   6 week follow up BRVO OD- (should have been 4 weeks- patient rescheduled due to work schedule). Things appear stable.  She has been working 12 hr days and eyes have not been their best because of it. Denies using tears.       Last edited by Rennis Chris, MD on 08/17/2021 11:25 PM.     Referring physician: Tower, Audrie Gallus, MD [address] [place],  [place] [address]  HISTORICAL INFORMATION:   Selected notes from the MEDICAL RECORD NUMBER Referred by Dr. Clydene Pugh for concern of BRVO LEE:  Ocular Hx- PMH-    CURRENT MEDICATIONS: No current outpatient medications on file. (Ophthalmic Drugs)   No current facility-administered medications for this visit. (Ophthalmic Drugs)   No current outpatient medications on file. (Other)   No current facility-administered medications for this visit. (Other)   REVIEW OF SYSTEMS: ROS   Positive for: Cardiovascular, Eyes, Respiratory Negative for: Constitutional, Gastrointestinal, Neurological, Skin, Genitourinary, Musculoskeletal, HENT, Endocrine, Psychiatric, Allergic/Imm, Heme/Lymph Last edited by Joni Reining, COA on 08/14/2021  9:25 AM.     ALLERGIES Allergies  Allergen Reactions   Metformin     REACTION: diarrhea   PAST MEDICAL HISTORY Past Medical History:  Diagnosis Date   Diabetes mellitus 2004   type II   Hyperlipidemia 2004   Hypertension 2004   Morbid obesity (HCC)    Sleep apnea    Past Surgical History:  Procedure Laterality  Date   bariactric surgery     CESAREAN SECTION     CHOLECYSTECTOMY     FAMILY HISTORY Family History  Problem Relation Age of Onset   Diabetes Mother    Cancer Mother        colon CA   Schizophrenia Mother    Diabetes Father    Cancer Sister        ovarian CA in 95s   Diabetes Brother    SOCIAL HISTORY Social History   Tobacco Use   Smoking status: Never   Smokeless tobacco: Never  Vaping Use   Vaping Use: Never used  Substance Use Topics   Alcohol use: No   Drug use: No       OPHTHALMIC EXAM:  Base Eye Exam     Visual Acuity (Snellen - Linear)       Right Left   Dist cc 20/60- 20/25   Dist ph cc 20/50 NI    Correction: Glasses         Tonometry (Tonopen, 9:34 AM)       Right Left   Pressure 17 15         Pupils       Dark Light Shape React APD   Right 3 2 Round Brisk None   Left 3 2 Round Brisk None         Visual Fields (Counting fingers)       Left Right  Full Full         Extraocular Movement       Right Left    Full Full         Neuro/Psych     Oriented x3: Yes   Mood/Affect: Normal         Dilation     Both eyes: 1.0% Mydriacyl, 2.5% Phenylephrine @ 9:34 AM           Slit Lamp and Fundus Exam     Slit Lamp Exam       Right Left   Lids/Lashes Dermatochalasis - upper lid, mild MGD Dermatochalasis - upper lid, mild MGD   Conjunctiva/Sclera White and quiet Mild temporal pinguecula   Cornea Mild tear film debris Mild tear film debris   Anterior Chamber Deep and quiet Deep and quiet   Iris Round and dilated Round and dilated   Lens 2+ Nuclear sclerosis, 2+ Cortical cataract 2+ Nuclear sclerosis, 2+ Cortical cataract   Anterior Vitreous Mild Vitreous syneresis Mild Vitreous syneresis, Posterior vitreous detachment, vitreous condensations         Fundus Exam       Right Left   Disc Pink and Sharp Pink and Sharp, focal PPP temporal   C/D Ratio 0.2 0.3   Macula Blunted foveal reflex, cluster of flame  hemes and DBH inferior macula -- BRVO with edema - slightly increased Flat, Blunted foveal reflex, RPE mottling, No heme or edema   Vessels attenuated, Tortuous, IT BRVO attenuated, Tortuous   Periphery Attached, focal cluster of drusen at 0200 equator, No heme, No RT/RD Attached, pigmented lattice at 0100 with retinal holes - good laser surrounding, no RT/RD/Lattice           Refraction     Wearing Rx       Sphere Cylinder Axis Add   Right -1.75 +0.75 008 +1.75   Left -2.25 +0.50 137 +1.75         Manifest Refraction       Sphere Cylinder Axis Dist VA   Right -2.25 +1.00 010 20/50-   Left               IMAGING AND PROCEDURES  Imaging and Procedures for 08/14/2021  OCT, Retina - OU - Both Eyes       Right Eye Quality was good. Central Foveal Thickness: 333. Progression has worsened. Findings include retinal drusen , vitreomacular adhesion , intraretinal hyper-reflective material, no SRF, normal foveal contour, intraretinal fluid (Interval increase in IRF and edema inferior macula and fovea, persistent IRHM).   Left Eye Quality was good. Central Foveal Thickness: 249. Progression has been stable. Findings include normal foveal contour, no IRF, no SRF, retinal drusen , myopic contour (Focal cluster of drusen ST macula).   Notes *Images captured and stored on drive  Diagnosis / Impression:  OD: inferior BRVO with CME -- Interval increase in IRF and edema inferior macula and fovea, persistent IRHM OS: non-exu ARMD   Clinical management:  See below  Abbreviations: NFP - Normal foveal profile. CME - cystoid macular edema. PED - pigment epithelial detachment. IRF - intraretinal fluid. SRF - subretinal fluid. EZ - ellipsoid zone. ERM - epiretinal membrane. ORA - outer retinal atrophy. ORT - outer retinal tubulation. SRHM - subretinal hyper-reflective material. IRHM - intraretinal hyper-reflective material      Intravitreal Injection, Pharmacologic Agent - OD - Right  Eye       Time Out 08/14/2021. 10:31 AM. Confirmed correct patient, procedure, site, and  patient consented.   Anesthesia Topical anesthesia was used. Anesthetic medications included Lidocaine 2%, Proparacaine 0.5%.   Procedure Preparation included 5% betadine to ocular surface, eyelid speculum. A (32g) needle was used.   Injection: 1.25 mg Bevacizumab 1.25mg /0.45ml   Route: Intravitreal, Site: Right Eye   NDC: [id number], Lot: [id number], Expiration date: 10/04/2021   Post-op Post injection exam found visual acuity of at least counting fingers. The patient tolerated the procedure well. There were no complications. The patient received written and verbal post procedure care education. Post injection medications were not given.            ASSESSMENT/PLAN:    ICD-10-CM   1. Branch retinal vein occlusion of right eye with macular edema  H34.8310 OCT, Retina - OU - Both Eyes    Intravitreal Injection, Pharmacologic Agent - OD - Right Eye    Bevacizumab (AVASTIN) SOLN 1.25 mg    2. Lattice degeneration of left retina  H35.412     3. Left retinal defect  H33.302     4. Intermediate stage nonexudative age-related macular degeneration of both eyes  H35.3132     5. Combined forms of age-related cataract of both eyes  H25.813      1. BRVO w/ CME OD  - delayed f/u -- 6 wks instead of 4  - s/p IVA OD #1 (02.14.23), #2 (03.14.23), #3 (04.11.23) - BCVA 20/50 decreased from 20/30 at 6 weeks, delayed 2 weeks because of work.   - OCT shows Interval increase in IRF and edema inferior macula and fovea at 6 wks - recommend IVA OD #4 today, 05.26.23 w/ f/u in 4 wks - pt wishes to proceed with IVA OD - RBA of procedure discussed, questions answered - informed consent obtained and signed - see procedure note  - F/U 4 weeks -- DFE/OCT/possible injection  2,3. Lattice degeneration w/ atrophic holes, left eye - pigmented lattice at 1230 with retinal holes - s/p laser retinopexy OS (02.21.23)  - good laser surrounding - monitor   4. Age related macular degeneration, non-exudative, both eyes  - The incidence, anatomy, and pathology of dry AMD, risk of progression, and the AREDS and AREDS 2 study including smoking risks discussed with patient.   - Recommend amsler grid monitoring  5. Mixed Cataract OU - The symptoms of cataract, surgical options, and treatments and risks were discussed with patient. - discussed diagnosis and progression  - monitor    Ophthalmic Meds Ordered this visit:  Meds ordered this encounter  Medications   Bevacizumab (AVASTIN) SOLN 1.25 mg     Return in about 4 weeks (around 09/11/2021) for DFE, OCT, possible injection.  There are no Patient Instructions on file for this visit.   Explained the diagnoses, plan, and follow up with the patient and they expressed understanding.  Patient expressed understanding of the importance of proper follow up care.   This document serves as a record of services personally performed by Karie Chimera, MD, PhD. It was created on their behalf by Joni Reining, an ophthalmic technician. The creation of this record is the provider's dictation and/or activities during the visit.    Electronically signed by: Joni Reining COA, 08/17/21  11:26 PM  Karie Chimera, M.D., Ph.D. Diseases & Surgery of the Retina and Vitreous [facility]  I have reviewed the above documentation for accuracy and completeness, and I agree with the above. Karie Chimera, M.D., Ph.D. 08/17/21 11:28 PM   Abbreviations: Judie Petit myopia (  nearsighted); A astigmatism; H hyperopia (farsighted); P presbyopia; Mrx spectacle prescription;  CTL contact lenses; OD right eye; OS left eye; OU both eyes  XT exotropia; ET esotropia; PEK punctate epithelial keratitis; PEE punctate epithelial erosions; DES dry eye syndrome; MGD meibomian gland dysfunction; ATs artificial tears; PFAT's preservative free artificial tears; NSC nuclear sclerotic  cataract; PSC posterior subcapsular cataract; ERM epi-retinal membrane; PVD posterior vitreous detachment; RD retinal detachment; DM diabetes mellitus; DR diabetic retinopathy; NPDR non-proliferative diabetic retinopathy; PDR proliferative diabetic retinopathy; CSME clinically significant macular edema; DME diabetic macular edema; dbh dot blot hemorrhages; CWS cotton wool spot; POAG primary open angle glaucoma; C/D cup-to-disc ratio; HVF humphrey visual field; GVF goldmann visual field; OCT optical coherence tomography; IOP intraocular pressure; BRVO Branch retinal vein occlusion; CRVO central retinal vein occlusion; CRAO central retinal artery occlusion; BRAO branch retinal artery occlusion; RT retinal tear; SB scleral buckle; PPV pars plana vitrectomy; VH Vitreous hemorrhage; PRP panretinal laser photocoagulation; IVK intravitreal kenalog; VMT vitreomacular traction; MH Macular hole;  NVD neovascularization of the disc; NVE neovascularization elsewhere; AREDS age related eye disease study; ARMD age related macular degeneration; POAG primary open angle glaucoma; EBMD epithelial/anterior basement membrane dystrophy; ACIOL anterior chamber intraocular lens; IOL intraocular lens; PCIOL posterior chamber intraocular lens; Phaco/IOL phacoemulsification with intraocular lens placement; PRK photorefractive keratectomy; LASIK laser assisted in situ keratomileusis; HTN hypertension; DM diabetes mellitus; COPD chronic obstructive pulmonary disease

## 2021-08-14 ENCOUNTER — Ambulatory Visit (INDEPENDENT_AMBULATORY_CARE_PROVIDER_SITE_OTHER): Payer: BC Managed Care – PPO | Admitting: Ophthalmology

## 2021-08-14 ENCOUNTER — Encounter (INDEPENDENT_AMBULATORY_CARE_PROVIDER_SITE_OTHER): Payer: Self-pay | Admitting: Ophthalmology

## 2021-08-14 DIAGNOSIS — H353132 Nonexudative age-related macular degeneration, bilateral, intermediate dry stage: Secondary | ICD-10-CM

## 2021-08-14 DIAGNOSIS — H34831 Tributary (branch) retinal vein occlusion, right eye, with macular edema: Secondary | ICD-10-CM | POA: Diagnosis not present

## 2021-08-14 DIAGNOSIS — H25813 Combined forms of age-related cataract, bilateral: Secondary | ICD-10-CM

## 2021-08-14 DIAGNOSIS — H33302 Unspecified retinal break, left eye: Secondary | ICD-10-CM | POA: Diagnosis not present

## 2021-08-14 DIAGNOSIS — H35412 Lattice degeneration of retina, left eye: Secondary | ICD-10-CM | POA: Diagnosis not present

## 2021-08-17 ENCOUNTER — Encounter (INDEPENDENT_AMBULATORY_CARE_PROVIDER_SITE_OTHER): Payer: Self-pay | Admitting: Ophthalmology

## 2021-08-17 MED ORDER — BEVACIZUMAB CHEMO INJECTION 1.25MG/0.05ML SYRINGE FOR KALEIDOSCOPE
1.2500 mg | INTRAVITREAL | Status: AC | PRN
  Administered 2021-08-17: 1.25 mg via INTRAVITREAL

## 2021-09-04 NOTE — Progress Notes (Shared)
[facility] - Clinic Note  09/11/2021     CHIEF COMPLAINT Patient presents for No chief complaint on file.   HISTORY OF PRESENT ILLNESS: Sara Glover is a 66 y.o. female who presents to the clinic today for:     Referring physician: Tower, Audrie Gallus, MD [address] [place],  [place] [address]  HISTORICAL INFORMATION:   Selected notes from the MEDICAL RECORD NUMBER Referred by Dr. Clydene Pugh for concern of BRVO LEE:  Ocular Hx- PMH-    CURRENT MEDICATIONS: No current outpatient medications on file. (Ophthalmic Drugs)   No current facility-administered medications for this visit. (Ophthalmic Drugs)   No current outpatient medications on file. (Other)   No current facility-administered medications for this visit. (Other)   REVIEW OF SYSTEMS:   ALLERGIES Allergies  Allergen Reactions   Metformin     REACTION: diarrhea   PAST MEDICAL HISTORY Past Medical History:  Diagnosis Date   Diabetes mellitus 2004   type II   Hyperlipidemia 2004   Hypertension 2004   Morbid obesity (HCC)    Sleep apnea    Past Surgical History:  Procedure Laterality Date   bariactric surgery     CESAREAN SECTION     CHOLECYSTECTOMY     FAMILY HISTORY Family History  Problem Relation Age of Onset   Diabetes Mother    Cancer Mother        colon CA   Schizophrenia Mother    Diabetes Father    Cancer Sister        ovarian CA in 53s   Diabetes Brother    SOCIAL HISTORY Social History   Tobacco Use   Smoking status: Never   Smokeless tobacco: Never  Vaping Use   Vaping Use: Never used  Substance Use Topics   Alcohol use: No   Drug use: No       OPHTHALMIC EXAM:  Not recorded    IMAGING AND PROCEDURES  Imaging and Procedures for 09/11/2021          ASSESSMENT/PLAN:    ICD-10-CM   1. Branch retinal vein occlusion of right eye with macular edema  H34.8310     2. Lattice degeneration of left retina  H35.412     3. Left  retinal defect  H33.302     4. Intermediate stage nonexudative age-related macular degeneration of both eyes  H35.3132     5. Combined forms of age-related cataract of both eyes  H25.813       1. BRVO w/ CME OD  - delayed f/u -- 6 wks instead of 4  - s/p IVA OD #1 (02.14.23), #2 (03.14.23), #3 (04.11.23), #4 (05.26.23) - BCVA 20/50 decreased from 20/30 at 6 weeks, delayed 2 weeks because of work.   - OCT shows Interval increase in IRF and edema inferior macula and fovea at 6 wks - recommend IVA OD #5 today, 06.23.23 w/ f/u in 4 wks - pt wishes to proceed with IVA OD - RBA of procedure discussed, questions answered - informed consent obtained and signed - see procedure note  - F/U 4 weeks -- DFE/OCT/possible injection  2,3. Lattice degeneration w/ atrophic holes, left eye - pigmented lattice at 1230 with retinal holes - s/p laser retinopexy OS (02.21.23) - good laser surrounding - monitor   4. Age related macular degeneration, non-exudative, both eyes  - The incidence, anatomy, and pathology of dry AMD, risk of progression, and the AREDS and AREDS 2 study including  smoking risks discussed with patient.   - Recommend amsler grid monitoring  5. Mixed Cataract OU - The symptoms of cataract, surgical options, and treatments and risks were discussed with patient. - discussed diagnosis and progression  - monitor   Ophthalmic Meds Ordered this visit:  No orders of the defined types were placed in this encounter.    No follow-ups on file.  There are no Patient Instructions on file for this visit.   Explained the diagnoses, plan, and follow up with the patient and they expressed understanding.  Patient expressed understanding of the importance of proper follow up care.   This document serves as a record of services personally performed by Karie Chimera, MD, PhD. It was created on their behalf by Joni Reining, an ophthalmic technician. The creation of this record is the provider's  dictation and/or activities during the visit.    Electronically signed by: Joni Reining COA, 09/04/21  1:10 PM  Karie Chimera, M.D., Ph.D. Diseases & Surgery of the Retina and Vitreous [facility]     Abbreviations: M myopia (nearsighted); A astigmatism; H hyperopia (farsighted); P presbyopia; Mrx spectacle prescription;  CTL contact lenses; OD right eye; OS left eye; OU both eyes  XT exotropia; ET esotropia; PEK punctate epithelial keratitis; PEE punctate epithelial erosions; DES dry eye syndrome; MGD meibomian gland dysfunction; ATs artificial tears; PFAT's preservative free artificial tears; NSC nuclear sclerotic cataract; PSC posterior subcapsular cataract; ERM epi-retinal membrane; PVD posterior vitreous detachment; RD retinal detachment; DM diabetes mellitus; DR diabetic retinopathy; NPDR non-proliferative diabetic retinopathy; PDR proliferative diabetic retinopathy; CSME clinically significant macular edema; DME diabetic macular edema; dbh dot blot hemorrhages; CWS cotton wool spot; POAG primary open angle glaucoma; C/D cup-to-disc ratio; HVF humphrey visual field; GVF goldmann visual field; OCT optical coherence tomography; IOP intraocular pressure; BRVO Branch retinal vein occlusion; CRVO central retinal vein occlusion; CRAO central retinal artery occlusion; BRAO branch retinal artery occlusion; RT retinal tear; SB scleral buckle; PPV pars plana vitrectomy; VH Vitreous hemorrhage; PRP panretinal laser photocoagulation; IVK intravitreal kenalog; VMT vitreomacular traction; MH Macular hole;  NVD neovascularization of the disc; NVE neovascularization elsewhere; AREDS age related eye disease study; ARMD age related macular degeneration; POAG primary open angle glaucoma; EBMD epithelial/anterior basement membrane dystrophy; ACIOL anterior chamber intraocular lens; IOL intraocular lens; PCIOL posterior chamber intraocular lens; Phaco/IOL phacoemulsification with intraocular  lens placement; PRK photorefractive keratectomy; LASIK laser assisted in situ keratomileusis; HTN hypertension; DM diabetes mellitus; COPD chronic obstructive pulmonary disease

## 2021-09-11 ENCOUNTER — Encounter (INDEPENDENT_AMBULATORY_CARE_PROVIDER_SITE_OTHER): Payer: Self-pay | Admitting: Ophthalmology

## 2021-09-11 ENCOUNTER — Ambulatory Visit (INDEPENDENT_AMBULATORY_CARE_PROVIDER_SITE_OTHER): Payer: BC Managed Care – PPO | Admitting: Ophthalmology

## 2021-09-11 DIAGNOSIS — H35412 Lattice degeneration of retina, left eye: Secondary | ICD-10-CM | POA: Diagnosis not present

## 2021-09-11 DIAGNOSIS — H33302 Unspecified retinal break, left eye: Secondary | ICD-10-CM | POA: Diagnosis not present

## 2021-09-11 DIAGNOSIS — H353132 Nonexudative age-related macular degeneration, bilateral, intermediate dry stage: Secondary | ICD-10-CM

## 2021-09-11 DIAGNOSIS — H34831 Tributary (branch) retinal vein occlusion, right eye, with macular edema: Secondary | ICD-10-CM

## 2021-09-11 DIAGNOSIS — H43813 Vitreous degeneration, bilateral: Secondary | ICD-10-CM

## 2021-09-11 DIAGNOSIS — H25813 Combined forms of age-related cataract, bilateral: Secondary | ICD-10-CM

## 2021-09-11 MED ORDER — BEVACIZUMAB CHEMO INJECTION 1.25MG/0.05ML SYRINGE FOR KALEIDOSCOPE
1.2500 mg | INTRAVITREAL | Status: AC | PRN
  Administered 2021-09-11: 1.25 mg via INTRAVITREAL

## 2021-10-02 NOTE — Progress Notes (Signed)
[facility] - Clinic Note  10/16/2021     CHIEF COMPLAINT Patient presents for Retina Follow Up   HISTORY OF PRESENT ILLNESS: Sara Glover is a 66 y.o. female who presents to the clinic today for:   HPI     Retina Follow Up   Patient presents with  CRVO/BRVO.  In right eye.  This started 5 weeks ago.  I, the attending physician,  performed the HPI with the patient and updated documentation appropriately.        Comments   Patient here for 5 weeks retina follow up for BRVO OD. Patient states vision doing pretty good. No eye pain.       Last edited by Rennis Chris, MD on 10/16/2021  3:05 PM.    Pt states she can see improvement in [place].   Referring physician: Tower, Audrie Gallus, MD [address] [place],  [place] [address]  HISTORICAL INFORMATION:   Selected notes from the MEDICAL RECORD NUMBER Referred by Dr. Clydene Pugh for concern of BRVO LEE:  Ocular Hx- PMH-    CURRENT MEDICATIONS: No current outpatient medications on file. (Ophthalmic Drugs)   No current facility-administered medications for this visit. (Ophthalmic Drugs)   No current outpatient medications on file. (Other)   No current facility-administered medications for this visit. (Other)   REVIEW OF SYSTEMS: ROS   Positive for: Cardiovascular, Eyes, Respiratory Negative for: Constitutional, Gastrointestinal, Neurological, Skin, Genitourinary, Musculoskeletal, HENT, Endocrine, Psychiatric, Allergic/Imm, Heme/Lymph Last edited by Laddie Aquas, COA on 10/16/2021  2:07 PM.     ALLERGIES Allergies  Allergen Reactions   Metformin     REACTION: diarrhea   PAST MEDICAL HISTORY Past Medical History:  Diagnosis Date   Diabetes mellitus 2004   type II   Hyperlipidemia 2004   Hypertension 2004   Morbid obesity (HCC)    Sleep apnea    Past Surgical History:  Procedure Laterality Date   bariactric surgery     CESAREAN SECTION     CHOLECYSTECTOMY     FAMILY  HISTORY Family History  Problem Relation Age of Onset   Diabetes Mother    Cancer Mother        colon CA   Schizophrenia Mother    Diabetes Father    Cancer Sister        ovarian CA in 68s   Diabetes Brother    SOCIAL HISTORY Social History   Tobacco Use   Smoking status: Never   Smokeless tobacco: Never  Vaping Use   Vaping Use: Never used  Substance Use Topics   Alcohol use: No   Drug use: No       OPHTHALMIC EXAM:  Base Eye Exam     Visual Acuity (Snellen - Linear)       Right Left   Dist cc 20/30 +1 20/20 -1   Dist ph cc 20/25 -1     Correction: Glasses         Tonometry (Tonopen, 2:05 PM)       Right Left   Pressure 13 10         Pupils       Dark Light Shape React APD   Right 3 2 Round Brisk None   Left 3 2 Round Brisk None         Visual Fields (Counting fingers)       Left Right    Full Full         Extraocular  Movement       Right Left    Full, Ortho Full, Ortho         Neuro/Psych     Oriented x3: Yes   Mood/Affect: Normal         Dilation     Both eyes: 1.0% Mydriacyl, 2.5% Phenylephrine @ 2:05 PM           Slit Lamp and Fundus Exam     Slit Lamp Exam       Right Left   Lids/Lashes Dermatochalasis - upper lid, mild MGD Dermatochalasis - upper lid, mild MGD   Conjunctiva/Sclera White and quiet Mild temporal pinguecula   Cornea Mild tear film debris Mild tear film debris   Anterior Chamber Deep and quiet Deep and quiet   Iris Round and dilated Round and dilated   Lens 2-3+ Nuclear sclerosis with early brunescence, 2+ Cortical cataract 2-3+ Nuclear sclerosis with mild brunescence, 2+ Cortical cataract   Anterior Vitreous Mild Vitreous syneresis, Posterior vitreous detachment, Vitreous condensations Mild Vitreous syneresis, Posterior vitreous detachment, vitreous condensations         Fundus Exam       Right Left   Disc Pink and Sharp Pink and Sharp, focal PPP temporal   C/D Ratio 0.2 0.3   Macula  Blunted foveal reflex, cluster of flame hemes and DBH inferior macula -- improved Flat, Blunted foveal reflex, RPE mottling, No heme or edema   Vessels attenuated, Tortuous, IT BRVO attenuated, Tortuous   Periphery Attached, focal cluster of drusen at 0200 equator, No heme, No RT/RD Attached, pigmented lattice at 0100 with retinal holes - good laser surrounding, no new RT/RD/Lattice           Refraction     Wearing Rx       Sphere Cylinder Axis Add   Right -1.75 +0.75 008 +1.75   Left -2.25 +0.50 137 +1.75           IMAGING AND PROCEDURES  Imaging and Procedures for 10/16/2021  OCT, Retina - OU - Both Eyes       Right Eye Quality was good. Central Foveal Thickness: 242. Progression has improved. Findings include normal foveal contour, no IRF, no SRF, retinal drusen , intraretinal hyper-reflective material (Interval improvement in IRF and edema inferior macula and fovea--resolved, persistent IRHM, stable release of VMA to PVD).   Left Eye Quality was good. Central Foveal Thickness: 247. Progression has been stable. Findings include normal foveal contour, no IRF, no SRF, myopic contour, retinal drusen (Focal cluster of drusen ST macula).   Notes *Images captured and stored on drive  Diagnosis / Impression:  OD: inferior BRVO with CME -- Interval improvement in IRF and edema inferior macula and fovea--resolved, persistent IRHM, stable release of VMA to PVD OS: non-exu ARMD   Clinical management:  See below  Abbreviations: NFP - Normal foveal profile. CME - cystoid macular edema. PED - pigment epithelial detachment. IRF - intraretinal fluid. SRF - subretinal fluid. EZ - ellipsoid zone. ERM - epiretinal membrane. ORA - outer retinal atrophy. ORT - outer retinal tubulation. SRHM - subretinal hyper-reflective material. IRHM - intraretinal hyper-reflective material      Intravitreal Injection, Pharmacologic Agent - OD - Right Eye       Time Out 10/16/2021. 2:30 PM.  Confirmed correct patient, procedure, site, and patient consented.   Anesthesia Topical anesthesia was used. Anesthetic medications included Lidocaine 2%, Proparacaine 0.5%.   Procedure Preparation included 5% betadine to ocular surface, eyelid speculum. A  supplied (32g) needle was used.   Injection: 1.25 mg Bevacizumab 1.25mg /0.71ml   Route: Intravitreal, Site: Right Eye   NDC: [id number], LotZS:[id number], Expiration date: 11/23/2021   Post-op Post injection exam found visual acuity of at least counting fingers. The patient tolerated the procedure well. There were no complications. The patient received written and verbal post procedure care education. Post injection medications were not given.            ASSESSMENT/PLAN:    ICD-10-CM   1. Branch retinal vein occlusion of right eye with macular edema  H34.8310 OCT, Retina - OU - Both Eyes    Intravitreal Injection, Pharmacologic Agent - OD - Right Eye    Bevacizumab (AVASTIN) SOLN 1.25 mg    2. Lattice degeneration of left retina  H35.412     3. Left retinal defect  H33.302     4. Posterior vitreous detachment of both eyes  H43.813     5. Intermediate stage nonexudative age-related macular degeneration of both eyes  H35.3132     6. Combined forms of age-related cataract of both eyes  H25.813      1. BRVO w/ CME OD  - s/p IVA OD #1 (02.14.23), #2 (03.14.23), #3 (04.11.23), #4 (05.26.23), #5 (06.23.23) - BCVA 20/30 improved from 20/50   - OCT shows Interval improvement in IRF and edema inferior macula and fovea, persistent IRHM, interval release of VMA to PVD at 5 wks - recommend IVA OD #6 today, 07.28.23 w/ ext f/u to 6 wks - pt wishes to proceed with IVA OD - RBA of procedure discussed, questions answered - informed consent obtained and signed - see procedure note  - F/U 6 weeks -- DFE/OCT/possible injection  2,3. Lattice degeneration w/ atrophic holes, left eye - pigmented lattice at 1230 with retinal holes - s/p  laser retinopexy OS (02.21.23) - good laser surrounding - monitor   4. PVD / vitreous syneresis OU  - OD with stable release of VMA to full PVD -- symptomatic flashes and floaters improved  - OS - stable PVD  - Discussed findings and prognosis  - No RT or RD on 360 exam OU  - Reviewed s/s of RT/RD  - Strict return precautions for any such RT/RD signs/symptoms  5. Age related macular degeneration, non-exudative, both eyes  - The incidence, anatomy, and pathology of dry AMD, risk of progression, and the AREDS and AREDS 2 study including smoking risks discussed with patient.   - Recommend amsler grid monitoring  6. Mixed Cataract OU - The symptoms of cataract, surgical options, and treatments and risks were discussed with patient. - discussed diagnosis and progression  - monitor   Ophthalmic Meds Ordered this visit:  Meds ordered this encounter  Medications   Bevacizumab (AVASTIN) SOLN 1.25 mg     Return in about 6 weeks (around 11/27/2021) for BRVO OD , DFE, OCT, possible injection.  There are no Patient Instructions on file for this visit.   Explained the diagnoses, plan, and follow up with the patient and they expressed understanding.  Patient expressed understanding of the importance of proper follow up care.   This document serves as a record of services personally performed by Gardiner Sleeper, MD, PhD. It was created on their behalf by Leonie Douglas, an ophthalmic technician. The creation of this record is the provider's dictation and/or activities during the visit.    Electronically signed by: Leonie Douglas COA, 10/16/21  3:06 PM  Gardiner Sleeper, M.D., Ph.D. Diseases &  Surgery of the Retina and Vitreous [facility]  I have reviewed the above documentation for accuracy and completeness, and I agree with the above. Gardiner Sleeper, M.D., Ph.D. 10/16/21 3:07 PM   Abbreviations: M myopia (nearsighted); A astigmatism; H hyperopia (farsighted); P  presbyopia; Mrx spectacle prescription;  CTL contact lenses; OD right eye; OS left eye; OU both eyes  XT exotropia; ET esotropia; PEK punctate epithelial keratitis; PEE punctate epithelial erosions; DES dry eye syndrome; MGD meibomian gland dysfunction; ATs artificial tears; PFAT's preservative free artificial tears; [place] nuclear sclerotic cataract; PSC posterior subcapsular cataract; ERM epi-retinal membrane; PVD posterior vitreous detachment; RD retinal detachment; DM diabetes mellitus; DR diabetic retinopathy; NPDR non-proliferative diabetic retinopathy; PDR proliferative diabetic retinopathy; CSME clinically significant macular edema; DME diabetic macular edema; dbh dot blot hemorrhages; CWS cotton wool spot; POAG primary open angle glaucoma; C/D cup-to-disc ratio; HVF humphrey visual field; GVF goldmann visual field; OCT optical coherence tomography; IOP intraocular pressure; BRVO Branch retinal vein occlusion; CRVO central retinal vein occlusion; CRAO central retinal artery occlusion; BRAO branch retinal artery occlusion; RT retinal tear; SB scleral buckle; PPV pars plana vitrectomy; VH Vitreous hemorrhage; PRP panretinal laser photocoagulation; IVK intravitreal kenalog; VMT vitreomacular traction; MH Macular hole;  NVD neovascularization of the disc; NVE neovascularization elsewhere; AREDS age related eye disease study; ARMD age related macular degeneration; POAG primary open angle glaucoma; EBMD epithelial/anterior basement membrane dystrophy; ACIOL anterior chamber intraocular lens; IOL intraocular lens; PCIOL posterior chamber intraocular lens; Phaco/IOL phacoemulsification with intraocular lens placement; [place] photorefractive keratectomy; LASIK laser assisted in situ keratomileusis; HTN hypertension; DM diabetes mellitus; COPD chronic obstructive pulmonary disease

## 2021-10-16 ENCOUNTER — Ambulatory Visit (INDEPENDENT_AMBULATORY_CARE_PROVIDER_SITE_OTHER): Payer: BC Managed Care – PPO | Admitting: Ophthalmology

## 2021-10-16 ENCOUNTER — Encounter (INDEPENDENT_AMBULATORY_CARE_PROVIDER_SITE_OTHER): Payer: Self-pay | Admitting: Ophthalmology

## 2021-10-16 DIAGNOSIS — H35412 Lattice degeneration of retina, left eye: Secondary | ICD-10-CM

## 2021-10-16 DIAGNOSIS — H34831 Tributary (branch) retinal vein occlusion, right eye, with macular edema: Secondary | ICD-10-CM | POA: Diagnosis not present

## 2021-10-16 DIAGNOSIS — H43813 Vitreous degeneration, bilateral: Secondary | ICD-10-CM | POA: Diagnosis not present

## 2021-10-16 DIAGNOSIS — H25813 Combined forms of age-related cataract, bilateral: Secondary | ICD-10-CM

## 2021-10-16 DIAGNOSIS — H353132 Nonexudative age-related macular degeneration, bilateral, intermediate dry stage: Secondary | ICD-10-CM | POA: Diagnosis not present

## 2021-10-16 DIAGNOSIS — H33302 Unspecified retinal break, left eye: Secondary | ICD-10-CM

## 2021-10-16 MED ORDER — BEVACIZUMAB CHEMO INJECTION 1.25MG/0.05ML SYRINGE FOR KALEIDOSCOPE
1.2500 mg | INTRAVITREAL | Status: AC | PRN
  Administered 2021-10-16: 1.25 mg via INTRAVITREAL

## 2021-11-27 NOTE — Progress Notes (Signed)
[facility] Clinic Note  11/30/2021     CHIEF COMPLAINT Patient presents for Retina Follow Up   HISTORY OF PRESENT ILLNESS: Sara Glover is a 66 y.o. female who presents to the clinic today for:   HPI     Retina Follow Up   Patient presents with  CRVO/BRVO.  In right eye.  Severity is moderate.  Duration of 6 weeks.  Since onset it is gradually improving.  I, the attending physician,  performed the HPI with the patient and updated documentation appropriately.        Comments   Pt here for 6 wk ret f/u for BRVO OD. Pt states [place] seems better.       Last edited by Bernarda Caffey, MD on 11/30/2021  6:56 PM.    Pt feels like vision is better   Referring physician: Tower, Wynelle Fanny, MD [place],  [place] [address]  HISTORICAL INFORMATION:   Selected notes from the MEDICAL RECORD NUMBER Referred by Dr. Ellin Mayhew for concern of BRVO LEE:  Ocular Hx- PMH-    CURRENT MEDICATIONS: No current outpatient medications on file. (Ophthalmic Drugs)   No current facility-administered medications for this visit. (Ophthalmic Drugs)   No current outpatient medications on file. (Other)   No current facility-administered medications for this visit. (Other)   REVIEW OF SYSTEMS: ROS   Positive for: Cardiovascular, Eyes, Respiratory Negative for: Constitutional, Gastrointestinal, Neurological, Skin, Genitourinary, Musculoskeletal, HENT, Endocrine, Psychiatric, Allergic/Imm, Heme/Lymph Last edited by Kingsley Spittle, COT on 11/30/2021  2:51 PM.      ALLERGIES Allergies  Allergen Reactions   Metformin     REACTION: diarrhea   PAST MEDICAL HISTORY Past Medical History:  Diagnosis Date   Diabetes mellitus 2004   type II   Hyperlipidemia 2004   Hypertension 2004   Morbid obesity ([place])    Sleep apnea    Past Surgical History:  Procedure Laterality Date   bariactric surgery     CESAREAN SECTION     CHOLECYSTECTOMY      FAMILY HISTORY Family History  Problem Relation Age of Onset   Diabetes Mother    Cancer Mother        colon CA   Schizophrenia Mother    Diabetes Father    Cancer Sister        ovarian CA in 47s   Diabetes Brother    SOCIAL HISTORY Social History   Tobacco Use   Smoking status: Never   Smokeless tobacco: Never  Vaping Use   Vaping Use: Never used  Substance Use Topics   Alcohol use: No   Drug use: No       OPHTHALMIC EXAM:  Base Eye Exam     Visual Acuity (Snellen - Linear)       Right Left   Dist cc 20/30 -1 20/20   Dist ph cc 20/25 -1     Correction: Glasses         Tonometry (Tonopen, 2:55 PM)       Right Left   Pressure 17 12         Pupils       Dark Light Shape React APD   Right 3 2 Round Brisk None   Left 3 2 Round Brisk None         Visual Fields (Counting fingers)       Left Right    Full Full  Extraocular Movement       Right Left    Full, Ortho Full, Ortho         Neuro/Psych     Oriented x3: Yes   Mood/Affect: Normal         Dilation     Both eyes: 1.0% Mydriacyl, 2.5% Phenylephrine @ 2:56 PM           Slit Lamp and Fundus Exam     Slit Lamp Exam       Right Left   Lids/Lashes Dermatochalasis - upper lid, mild MGD Dermatochalasis - upper lid, mild MGD   Conjunctiva/Sclera White and quiet Mild temporal pinguecula   Cornea Mild tear film debris Mild tear film debris   Anterior Chamber Deep and quiet Deep and quiet   Iris Round and dilated Round and dilated   Lens 2-3+ Nuclear sclerosis with early brunescence, 2+ Cortical cataract 2-3+ Nuclear sclerosis with mild brunescence, 2+ Cortical cataract   Anterior Vitreous Mild Vitreous syneresis, Posterior vitreous detachment, Vitreous condensations Mild Vitreous syneresis, Posterior vitreous detachment, vitreous condensations         Fundus Exam       Right Left   Disc Pink and Sharp Pink and Sharp, focal PPP temporal   C/D Ratio 0.2 0.3    Macula Blunted foveal reflex, cluster of flame hemes and DBH inferior macula -- almost resolved Flat, Blunted foveal reflex, RPE mottling, No heme or edema   Vessels attenuated, Tortuous, IT BRVO attenuated, Tortuous   Periphery Attached, focal cluster of drusen at 0200 equator, No heme, No RT/RD Attached, pigmented lattice at 0100 with retinal holes - good laser surrounding, no new RT/RD/Lattice           Refraction     Wearing Rx       Sphere Cylinder Axis Add   Right -1.75 +0.75 008 +1.75   Left -2.25 +0.50 137 +1.75           IMAGING AND PROCEDURES  Imaging and Procedures for 11/30/2021  OCT, Retina - OU - Both Eyes       Right Eye Quality was good. Central Foveal Thickness: 237. Progression has been stable. Findings include normal foveal contour, no IRF, no SRF, retinal drusen , intraretinal hyper-reflective material (stable improvement in IRF and edema inferior macula and fovea--resolved, persistent IRHM).   Left Eye Quality was good. Central Foveal Thickness: 248. Progression has been stable. Findings include normal foveal contour, no IRF, no SRF, myopic contour, retinal drusen (Focal cluster of drusen ST macula).   Notes *Images captured and stored on drive  Diagnosis / Impression:  OD: inferior BRVO with CME -- stable improvement in IRF and edema inferior macula and fovea--resolved, persistent IRHM OS: non-exu ARMD   Clinical management:  See below  Abbreviations: NFP - Normal foveal profile. CME - cystoid macular edema. PED - pigment epithelial detachment. IRF - intraretinal fluid. SRF - subretinal fluid. EZ - ellipsoid zone. ERM - epiretinal membrane. ORA - outer retinal atrophy. ORT - outer retinal tubulation. SRHM - subretinal hyper-reflective material. IRHM - intraretinal hyper-reflective material      Intravitreal Injection, Pharmacologic Agent - OD - Right Eye       Time Out 11/30/2021. 3:43 PM. Confirmed correct patient, procedure, site, and patient  consented.   Anesthesia Topical anesthesia was used. Anesthetic medications included Lidocaine 2%, Proparacaine 0.5%.   Procedure Preparation included 5% betadine to ocular surface, eyelid speculum. A (32g) needle was used.   Injection: 1.25 mg  Bevacizumab 1.70m/0.05ml   Route: Intravitreal, Site: Right Eye   NDC: [id number] Lot:: [id number] Expiration date: 01/12/2022   Post-op Post injection exam found visual acuity of at least counting fingers. The patient tolerated the procedure well. There were no complications. The patient received written and verbal post procedure care education. Post injection medications were not given.            ASSESSMENT/PLAN:    ICD-10-CM   1. Branch retinal vein occlusion of right eye with macular edema  H34.8310 OCT, Retina - OU - Both Eyes    Intravitreal Injection, Pharmacologic Agent - OD - Right Eye    Bevacizumab (AVASTIN) SOLN 1.25 mg    2. Lattice degeneration of left retina  H35.412     3. Left retinal defect  H33.302     4. Posterior vitreous detachment of both eyes  H43.813     5. Intermediate stage nonexudative age-related macular degeneration of both eyes  H35.3132     6. Combined forms of age-related cataract of both eyes  H25.813      1. BRVO w/ CME OD  - s/p IVA OD #1 (02.14.23), #2 (03.14.23), #3 (04.11.23), #4 (05.26.23), #5 (06.23.23), #6 (07.28.23) - BCVA 20/25 -- stable   - OCT shows stable improvement in IRF and edema inferior macula and fovea--resolved, persistent IRHM at 6 wks - recommend IVA OD #7 today, 09.11.23 w/ ext f/u to 8 wks - pt wishes to proceed with IVA OD - RBA of procedure discussed, questions answered - informed consent obtained and signed - see procedure note  - F/U 8 weeks -- DFE/OCT/possible injection  2,3. Lattice degeneration w/ atrophic holes, left eye - pigmented lattice at 1230 with retinal holes - s/p laser retinopexy OS (02.21.23) - good laser surrounding - monitor  4. PVD /  vitreous syneresis OU  - OD with stable release of VMA to full PVD -- symptomatic flashes and floaters improved  - OS - stable PVD  - Discussed findings and prognosis  - No RT or RD on 360 exam OU  - Reviewed s/s of RT/RD  - strict return precautions for any such RT/RD symptoms  5. Age related macular degeneration, non-exudative, both eyes  - The incidence, anatomy, and pathology of dry AMD, risk of progression, and the AREDS and AREDS 2 study including smoking risks discussed with patient.   - recommend Amsler grid monitoring  6. Mixed Cataract OU - The symptoms of cataract, surgical options, and treatments and risks were discussed with patient. - discussed diagnosis and progression  - monitor  Ophthalmic Meds Ordered this visit:  Meds ordered this encounter  Medications   Bevacizumab (AVASTIN) SOLN 1.25 mg     Return in about 8 weeks (around 01/25/2022) for f/u BRVO OD, DFE, OCT.  There are no Patient Instructions on file for this visit.   Explained the diagnoses, plan, and follow up with the patient and they expressed understanding.  Patient expressed understanding of the importance of proper follow up care.   This document serves as a record of services personally performed by BGardiner Sleeper MD, PhD. It was created on their behalf by DRoselee Nova COMT. The creation of this record is the provider's dictation and/or activities during the visit.  Electronically signed by: DRoselee Nova COMT 11/30/21 11:34 PM  This document serves as a record of services personally performed by BGardiner Sleeper MD, PhD. It was created on their behalf by ASan Jetty BOwens Shark OA an ophthalmic technician.  The creation of this record is the provider's dictation and/or activities during the visit.    Electronically signed by: San Jetty. Marguerita Merles 09.11.2023 11:34 PM  Gardiner Sleeper, M.D., Ph.D. Diseases & Surgery of the Retina and Vitreous [facility]  I have reviewed the  above documentation for accuracy and completeness, and I agree with the above. Gardiner Sleeper, M.D., Ph.D. 11/30/21 11:36 PM  Abbreviations: M myopia (nearsighted); A astigmatism; H hyperopia (farsighted); P presbyopia; Mrx spectacle prescription;  CTL contact lenses; OD right eye; OS left eye; OU both eyes  XT exotropia; ET esotropia; PEK punctate epithelial keratitis; PEE punctate epithelial erosions; DES dry eye syndrome; MGD meibomian gland dysfunction; ATs artificial tears; PFAT's preservative free artificial tears; [place] nuclear sclerotic cataract; PSC posterior subcapsular cataract; ERM epi-retinal membrane; PVD posterior vitreous detachment; RD retinal detachment; DM diabetes mellitus; DR diabetic retinopathy; NPDR non-proliferative diabetic retinopathy; PDR proliferative diabetic retinopathy; CSME clinically significant macular edema; DME diabetic macular edema; dbh dot blot hemorrhages; CWS cotton wool spot; POAG primary open angle glaucoma; C/D cup-to-disc ratio; HVF humphrey visual field; GVF goldmann visual field; OCT optical coherence tomography; IOP intraocular pressure; BRVO Branch retinal vein occlusion; CRVO central retinal vein occlusion; CRAO central retinal artery occlusion; BRAO branch retinal artery occlusion; RT retinal tear; SB scleral buckle; PPV pars plana vitrectomy; VH Vitreous hemorrhage; PRP panretinal laser photocoagulation; IVK intravitreal kenalog; VMT vitreomacular traction; MH Macular hole;  NVD neovascularization of the disc; NVE neovascularization elsewhere; AREDS age related eye disease study; ARMD age related macular degeneration; POAG primary open angle glaucoma; EBMD epithelial/anterior basement membrane dystrophy; ACIOL anterior chamber intraocular lens; IOL intraocular lens; PCIOL posterior chamber intraocular lens; Phaco/IOL phacoemulsification with intraocular lens placement; [place] photorefractive keratectomy; LASIK laser assisted in situ keratomileusis; HTN  hypertension; DM diabetes mellitus; COPD chronic obstructive pulmonary disease

## 2021-11-30 ENCOUNTER — Encounter (INDEPENDENT_AMBULATORY_CARE_PROVIDER_SITE_OTHER): Payer: Self-pay | Admitting: Ophthalmology

## 2021-11-30 ENCOUNTER — Ambulatory Visit (INDEPENDENT_AMBULATORY_CARE_PROVIDER_SITE_OTHER): Payer: BC Managed Care – PPO | Admitting: Ophthalmology

## 2021-11-30 DIAGNOSIS — H353132 Nonexudative age-related macular degeneration, bilateral, intermediate dry stage: Secondary | ICD-10-CM | POA: Diagnosis not present

## 2021-11-30 DIAGNOSIS — H33302 Unspecified retinal break, left eye: Secondary | ICD-10-CM | POA: Diagnosis not present

## 2021-11-30 DIAGNOSIS — H35412 Lattice degeneration of retina, left eye: Secondary | ICD-10-CM

## 2021-11-30 DIAGNOSIS — H34831 Tributary (branch) retinal vein occlusion, right eye, with macular edema: Secondary | ICD-10-CM

## 2021-11-30 DIAGNOSIS — H43813 Vitreous degeneration, bilateral: Secondary | ICD-10-CM | POA: Diagnosis not present

## 2021-11-30 DIAGNOSIS — H25813 Combined forms of age-related cataract, bilateral: Secondary | ICD-10-CM

## 2021-11-30 MED ORDER — BEVACIZUMAB CHEMO INJECTION 1.25MG/0.05ML SYRINGE FOR KALEIDOSCOPE
1.2500 mg | INTRAVITREAL | Status: AC | PRN
  Administered 2021-11-30: 1.25 mg via INTRAVITREAL

## 2022-01-22 ENCOUNTER — Encounter (INDEPENDENT_AMBULATORY_CARE_PROVIDER_SITE_OTHER): Payer: BC Managed Care – PPO | Admitting: Ophthalmology

## 2022-01-22 NOTE — Progress Notes (Shared)
[facility] - Clinic Note  01/25/2022     CHIEF COMPLAINT Patient presents for No chief complaint on file.   HISTORY OF PRESENT ILLNESS: Sara Glover is a 66 y.o. female who presents to the clinic today for:    Pt feels like vision is better   Referring physician: Tower, Audrie Gallus, MD [address] [place],  [place] [address]  HISTORICAL INFORMATION:   Selected notes from the MEDICAL RECORD NUMBER Referred by Dr. Clydene Pugh for concern of BRVO LEE:  Ocular Hx- PMH-    CURRENT MEDICATIONS: No current outpatient medications on file. (Ophthalmic Drugs)   No current facility-administered medications for this visit. (Ophthalmic Drugs)   No current outpatient medications on file. (Other)   No current facility-administered medications for this visit. (Other)   REVIEW OF SYSTEMS:    ALLERGIES Allergies  Allergen Reactions   Metformin     REACTION: diarrhea   PAST MEDICAL HISTORY Past Medical History:  Diagnosis Date   Diabetes mellitus 2004   type II   Hyperlipidemia 2004   Hypertension 2004   Morbid obesity (HCC)    Sleep apnea    Past Surgical History:  Procedure Laterality Date   bariactric surgery     CESAREAN SECTION     CHOLECYSTECTOMY     FAMILY HISTORY Family History  Problem Relation Age of Onset   Diabetes Mother    Cancer Mother        colon CA   Schizophrenia Mother    Diabetes Father    Cancer Sister        ovarian CA in 60s   Diabetes Brother    SOCIAL HISTORY Social History   Tobacco Use   Smoking status: Never   Smokeless tobacco: Never  Vaping Use   Vaping Use: Never used  Substance Use Topics   Alcohol use: No   Drug use: No       OPHTHALMIC EXAM:  Not recorded    IMAGING AND PROCEDURES  Imaging and Procedures for 01/25/2022          ASSESSMENT/PLAN:    ICD-10-CM   1. Branch retinal vein occlusion of right eye with macular edema  H34.8310     2. Lattice degeneration of  left retina  H35.412     3. Left retinal defect  H33.302     4. Posterior vitreous detachment of both eyes  H43.813     5. Intermediate stage nonexudative age-related macular degeneration of both eyes  H35.3132     6. Combined forms of age-related cataract of both eyes  H25.813       1. BRVO w/ CME OD  - s/p IVA OD #1 (02.14.23), #2 (03.14.23), #3 (04.11.23), #4 (05.26.23), #5 (06.23.23), #6 (07.28.23), #7 (09.11.23) - BCVA 20/25 -- stable   - OCT shows stable improvement in IRF and edema inferior macula and fovea--resolved, persistent IRHM at 6 wks - recommend IVA OD #8 today, 11.06.23 w/ ext f/u to 8 wks - pt wishes to proceed with IVA OD - RBA of procedure discussed, questions answered - informed consent obtained and signed - see procedure note  - F/U 8 weeks -- DFE/OCT/possible injection  2,3. Lattice degeneration w/ atrophic holes, left eye - pigmented lattice at 1230 with retinal holes - s/p laser retinopexy OS (02.21.23) - good laser surrounding - monitor  4. PVD / vitreous syneresis OU  - OD with stable release of VMA to full PVD -- symptomatic flashes  and floaters improved  - OS - stable PVD  - Discussed findings and prognosis  - No RT or RD on 360 exam OU  - Reviewed s/s of RT/RD  - strict return precautions for any such RT/RD symptoms  5. Age related macular degeneration, non-exudative, both eyes  - The incidence, anatomy, and pathology of dry AMD, risk of progression, and the AREDS and AREDS 2 study including smoking risks discussed with patient.   - recommend Amsler grid monitoring  6. Mixed Cataract OU - The symptoms of cataract, surgical options, and treatments and risks were discussed with patient. - discussed diagnosis and progression  - monitor  Ophthalmic Meds Ordered this visit:  No orders of the defined types were placed in this encounter.    No follow-ups on file.  There are no Patient Instructions on file for this visit.   Explained the  diagnoses, plan, and follow up with the patient and they expressed understanding.  Patient expressed understanding of the importance of proper follow up care.   This document serves as a record of services personally performed by Gardiner Sleeper, MD, PhD. It was created on their behalf by Roselee Nova, COMT. The creation of this record is the provider's dictation and/or activities during the visit.  Electronically signed by: Roselee Nova, COMT 01/22/22 5:32 PM    Gardiner Sleeper, M.D., Ph.D. Diseases & Surgery of the Retina and Vitreous [facility]: M myopia (nearsighted); A astigmatism; H hyperopia (farsighted); P presbyopia; Mrx spectacle prescription;  CTL contact lenses; OD right eye; OS left eye; OU both eyes  XT exotropia; ET esotropia; PEK punctate epithelial keratitis; PEE punctate epithelial erosions; DES dry eye syndrome; MGD meibomian gland dysfunction; ATs artificial tears; PFAT's preservative free artificial tears; [place] nuclear sclerotic cataract; PSC posterior subcapsular cataract; ERM epi-retinal membrane; PVD posterior vitreous detachment; RD retinal detachment; DM diabetes mellitus; DR diabetic retinopathy; NPDR non-proliferative diabetic retinopathy; PDR proliferative diabetic retinopathy; CSME clinically significant macular edema; DME diabetic macular edema; dbh dot blot hemorrhages; CWS cotton wool spot; POAG primary open angle glaucoma; C/D cup-to-disc ratio; HVF humphrey visual field; GVF goldmann visual field; OCT optical coherence tomography; IOP intraocular pressure; BRVO Branch retinal vein occlusion; CRVO central retinal vein occlusion; CRAO central retinal artery occlusion; BRAO branch retinal artery occlusion; RT retinal tear; SB scleral buckle; PPV pars plana vitrectomy; VH Vitreous hemorrhage; PRP panretinal laser photocoagulation; IVK intravitreal kenalog; VMT vitreomacular traction; MH Macular hole;  NVD neovascularization of the  disc; NVE neovascularization elsewhere; AREDS age related eye disease study; ARMD age related macular degeneration; POAG primary open angle glaucoma; EBMD epithelial/anterior basement membrane dystrophy; ACIOL anterior chamber intraocular lens; IOL intraocular lens; PCIOL posterior chamber intraocular lens; Phaco/IOL phacoemulsification with intraocular lens placement; [place] photorefractive keratectomy; LASIK laser assisted in situ keratomileusis; HTN hypertension; DM diabetes mellitus; COPD chronic obstructive pulmonary disease

## 2022-01-25 ENCOUNTER — Encounter (INDEPENDENT_AMBULATORY_CARE_PROVIDER_SITE_OTHER): Payer: BC Managed Care – PPO | Admitting: Ophthalmology

## 2022-01-25 DIAGNOSIS — H35412 Lattice degeneration of retina, left eye: Secondary | ICD-10-CM

## 2022-01-25 DIAGNOSIS — H25813 Combined forms of age-related cataract, bilateral: Secondary | ICD-10-CM

## 2022-01-25 DIAGNOSIS — H33302 Unspecified retinal break, left eye: Secondary | ICD-10-CM

## 2022-01-25 DIAGNOSIS — H353132 Nonexudative age-related macular degeneration, bilateral, intermediate dry stage: Secondary | ICD-10-CM

## 2022-01-25 DIAGNOSIS — H43813 Vitreous degeneration, bilateral: Secondary | ICD-10-CM

## 2022-01-25 DIAGNOSIS — H34831 Tributary (branch) retinal vein occlusion, right eye, with macular edema: Secondary | ICD-10-CM

## 2022-01-26 NOTE — Progress Notes (Signed)
[facility] Clinic Note  02/02/2022     CHIEF COMPLAINT Patient presents for Retina Follow Up   HISTORY OF PRESENT ILLNESS: Sara Glover is a 66 y.o. female who presents to the clinic today for:   HPI     Retina Follow Up   Patient presents with  CRVO/BRVO.  In right eye.  This started 8 weeks ago.  Severity is moderate.  Duration of 8 weeks.  Since onset it is gradually improving.  I, the attending physician,  performed the HPI with the patient and updated documentation appropriately.        Comments   8 week retina follow up pt states no vision changes noticed she has a floater in the right eye denies any floaters       Last edited by Bernarda Caffey, MD on 02/02/2022 10:20 PM.     Pt feels like right eye vision is doing good  Referring physician: Tower, Wynelle Fanny, MD [place],  [place] [address]  HISTORICAL INFORMATION:   Selected notes from the MEDICAL RECORD NUMBER Referred by Dr. Ellin Mayhew for concern of BRVO LEE:  Ocular Hx- PMH-    CURRENT MEDICATIONS: No current outpatient medications on file. (Ophthalmic Drugs)   No current facility-administered medications for this visit. (Ophthalmic Drugs)   No current outpatient medications on file. (Other)   No current facility-administered medications for this visit. (Other)   REVIEW OF SYSTEMS:    ALLERGIES Allergies  Allergen Reactions   Metformin     REACTION: diarrhea   PAST MEDICAL HISTORY Past Medical History:  Diagnosis Date   Diabetes mellitus 2004   type II   Hyperlipidemia 2004   Hypertension 2004   Morbid obesity ([place])    Sleep apnea    Past Surgical History:  Procedure Laterality Date   bariactric surgery     CESAREAN SECTION     CHOLECYSTECTOMY     FAMILY HISTORY Family History  Problem Relation Age of Onset   Diabetes Mother    Cancer Mother        colon CA   Schizophrenia Mother    Diabetes Father    Cancer Sister         ovarian CA in 88s   Diabetes Brother    SOCIAL HISTORY Social History   Tobacco Use   Smoking status: Never   Smokeless tobacco: Never  Vaping Use   Vaping Use: Never used  Substance Use Topics   Alcohol use: No   Drug use: No       OPHTHALMIC EXAM:  Base Eye Exam     Visual Acuity (Snellen - Linear)       Right Left   Dist cc 20/40 -2 20/25   Dist ph cc 20/30     Correction: Glasses         Tonometry (Tonopen, 3:02 PM)       Right Left   Pressure 13 14         Pupils       Pupils Dark Light Shape React APD   Right PERRL 3 2 Round Brisk None   Left PERRL 3 2 Round Brisk None         Visual Fields       Left Right    Full Full         Extraocular Movement       Right Left    Full, Ortho Full, Ortho  Neuro/Psych     Oriented x3: Yes   Mood/Affect: Normal         Dilation     Both eyes: 2.5% Phenylephrine @ 3:02 PM           Slit Lamp and Fundus Exam     Slit Lamp Exam       Right Left   Lids/Lashes Dermatochalasis - upper lid, mild MGD Dermatochalasis - upper lid, mild MGD   Conjunctiva/Sclera White and quiet Mild temporal pinguecula   Cornea Mild tear film debris Mild tear film debris   Anterior Chamber Deep and quiet Deep and quiet   Iris Round and dilated Round and dilated   Lens 2-3+ Nuclear sclerosis with early brunescence, 2+ Cortical cataract 2-3+ Nuclear sclerosis with mild brunescence, 2+ Cortical cataract   Anterior Vitreous Mild Vitreous syneresis, Posterior vitreous detachment, Vitreous condensations Mild Vitreous syneresis, Posterior vitreous detachment, vitreous condensations         Fundus Exam       Right Left   Disc Pink and Sharp Pink and Sharp, focal PPP temporal   C/D Ratio 0.2 0.3   Macula Blunted foveal reflex, cluster of flame hemes and DBH inferior macula -- almost resolved, interval increase in edema IN mac Flat, Blunted foveal reflex, RPE mottling, No heme or edema   Vessels  attenuated, Tortuous, IT BRVO attenuated, mild tortuosity   Periphery Attached, focal cluster of drusen at 0200 equator, No heme, No RT/RD Attached, pigmented lattice at 0100 with retinal holes - good laser surrounding, no new RT/RD/Lattice           Refraction     Wearing Rx       Sphere Cylinder Axis Add   Right -1.75 +0.75 008 +1.75   Left -2.25 +0.50 137 +1.75           IMAGING AND PROCEDURES  Imaging and Procedures for 02/02/2022  OCT, Retina - OU - Both Eyes       Right Eye Quality was good. Central Foveal Thickness: 279. Progression has worsened. Findings include normal foveal contour, no IRF, no SRF, retinal drusen , intraretinal hyper-reflective material (Interval increase in IRF/edema IN fovea and macula, persistent IRHM).   Left Eye Quality was good. Central Foveal Thickness: 248. Progression has been stable. Findings include normal foveal contour, no IRF, no SRF, myopic contour, retinal drusen (Focal cluster of drusen ST macula).   Notes *Images captured and stored on drive  Diagnosis / Impression:  OD: inferior BRVO with CME -- Interval increase in IRF/edema IN fovea and macula, persistent IRHM OS: non-exu ARMD   Clinical management:  See below  Abbreviations: NFP - Normal foveal profile. CME - cystoid macular edema. PED - pigment epithelial detachment. IRF - intraretinal fluid. SRF - subretinal fluid. EZ - ellipsoid zone. ERM - epiretinal membrane. ORA - outer retinal atrophy. ORT - outer retinal tubulation. SRHM - subretinal hyper-reflective material. IRHM - intraretinal hyper-reflective material      Intravitreal Injection, Pharmacologic Agent - OD - Right Eye       Time Out 02/02/2022. 3:45 PM. Confirmed correct patient, procedure, site, and patient consented.   Anesthesia Topical anesthesia was used. Anesthetic medications included Lidocaine 2%, Proparacaine 0.5%.   Procedure Preparation included 5% betadine to ocular surface, eyelid  speculum. A (32g) needle was used.   Injection: 1.25 mg Bevacizumab 1.46m/0.05ml   Route: Intravitreal, Site: Right Eye   NDC: 50242-060-01, Lot:: [id number] Expiration date: 03/07/2022   Post-op Post injection exam found  visual acuity of at least counting fingers. The patient tolerated the procedure well. There were no complications. The patient received written and verbal post procedure care education. Post injection medications were not given.            ASSESSMENT/PLAN:    ICD-10-CM   1. Branch retinal vein occlusion of right eye with macular edema  H34.8310 OCT, Retina - OU - Both Eyes    Intravitreal Injection, Pharmacologic Agent - OD - Right Eye    Bevacizumab (AVASTIN) SOLN 1.25 mg    2. Lattice degeneration of left retina  H35.412     3. Left retinal defect  H33.302     4. Posterior vitreous detachment of both eyes  H43.813     5. Intermediate stage nonexudative age-related macular degeneration of both eyes  H35.3132     6. Combined forms of age-related cataract of both eyes  H25.813      1. BRVO w/ CME OD  - f/u delayed to 9 wks instead of 8  - s/p IVA OD #1 (02.14.23), #2 (03.14.23), #3 (04.11.23), #4 (05.26.23), #5 (06.23.23), #6 (07.28.23), #7 (09.11.23) - BCVA 20/25 -- stable   - OCT shows Interval increase in IRF/edema IN fovea and macula, persistent IRHM at 9 weeks - recommend IVA OD #8 today, 11.14.23 w/ f/u back to 7 wks - pt wishes to proceed with IVA OD - RBA of procedure discussed, questions answered - informed consent obtained and signed - see procedure note  - F/U 7 weeks -- DFE/OCT/possible injection  2,3. Lattice degeneration w/ atrophic holes, left eye - pigmented lattice at 1230 with retinal holes - s/p laser retinopexy OS (02.21.23) - good laser surrounding - monitor  4. PVD / vitreous syneresis OU  - OD with stable release of VMA to full PVD -- symptomatic flashes and floaters improved  - OS - stable PVD  - Discussed findings and  prognosis  - No RT or RD on 360 exam OU  - Reviewed s/s of RT/RD  - strict return precautions for any such RT/RD symptoms  5. Age related macular degeneration, non-exudative, both eyes  - The incidence, anatomy, and pathology of dry AMD, risk of progression, and the AREDS and AREDS 2 study including smoking risks discussed with patient.   - continue Amsler grid monitoring  6. Mixed Cataract OU - The symptoms of cataract, surgical options, and treatments and risks were discussed with patient. - discussed diagnosis and progression  - monitor  Ophthalmic Meds Ordered this visit:  Meds ordered this encounter  Medications   Bevacizumab (AVASTIN) SOLN 1.25 mg     Return in about 7 weeks (around 03/23/2022) for f/u 7 weeks, BRVO OD, DFE, OCT.  There are no Patient Instructions on file for this visit.   Explained the diagnoses, plan, and follow up with the patient and they expressed understanding.  Patient expressed understanding of the importance of proper follow up care.   This document serves as a record of services personally performed by Gardiner Sleeper, MD, PhD. It was created on their behalf by Renaldo Reel, [place] an ophthalmic technician. The creation of this record is the provider's dictation and/or activities during the visit.    Electronically signed by:  Renaldo Reel, COT  11.07.23 10:23 PM  This document serves as a record of services personally performed by Gardiner Sleeper, MD, PhD. It was created on their behalf by San Jetty. Owens Shark, OA an ophthalmic technician. The creation of this record is  the provider's dictation and/or activities during the visit.    Electronically signed by: San Jetty. Owens Shark, [place] 11.14.2023 10:23 PM  Gardiner Sleeper, M.D., Ph.D. Diseases & Surgery of the Retina and Vitreous [facility]  I have reviewed the above documentation for accuracy and completeness, and I agree with the above. Gardiner Sleeper, M.D., Ph.D. 02/02/22  10:31 PM  Abbreviations: M myopia (nearsighted); A astigmatism; H hyperopia (farsighted); P presbyopia; Mrx spectacle prescription;  CTL contact lenses; OD right eye; OS left eye; OU both eyes  XT exotropia; ET esotropia; PEK punctate epithelial keratitis; PEE punctate epithelial erosions; DES dry eye syndrome; MGD meibomian gland dysfunction; ATs artificial tears; PFAT's preservative free artificial tears; [place] nuclear sclerotic cataract; PSC posterior subcapsular cataract; ERM epi-retinal membrane; PVD posterior vitreous detachment; RD retinal detachment; DM diabetes mellitus; DR diabetic retinopathy; NPDR non-proliferative diabetic retinopathy; PDR proliferative diabetic retinopathy; CSME clinically significant macular edema; DME diabetic macular edema; dbh dot blot hemorrhages; CWS cotton wool spot; POAG primary open angle glaucoma; C/D cup-to-disc ratio; HVF humphrey visual field; GVF goldmann visual field; OCT optical coherence tomography; IOP intraocular pressure; BRVO Branch retinal vein occlusion; CRVO central retinal vein occlusion; CRAO central retinal artery occlusion; BRAO branch retinal artery occlusion; RT retinal tear; SB scleral buckle; PPV pars plana vitrectomy; VH Vitreous hemorrhage; PRP panretinal laser photocoagulation; IVK intravitreal kenalog; VMT vitreomacular traction; MH Macular hole;  NVD neovascularization of the disc; NVE neovascularization elsewhere; AREDS age related eye disease study; ARMD age related macular degeneration; POAG primary open angle glaucoma; EBMD epithelial/anterior basement membrane dystrophy; ACIOL anterior chamber intraocular lens; IOL intraocular lens; PCIOL posterior chamber intraocular lens; Phaco/IOL phacoemulsification with intraocular lens placement; [place] photorefractive keratectomy; LASIK laser assisted in situ keratomileusis; HTN hypertension; DM diabetes mellitus; COPD chronic obstructive pulmonary disease

## 2022-02-02 ENCOUNTER — Encounter (INDEPENDENT_AMBULATORY_CARE_PROVIDER_SITE_OTHER): Payer: Self-pay | Admitting: Ophthalmology

## 2022-02-02 ENCOUNTER — Ambulatory Visit (INDEPENDENT_AMBULATORY_CARE_PROVIDER_SITE_OTHER): Payer: BC Managed Care – PPO | Admitting: Ophthalmology

## 2022-02-02 DIAGNOSIS — H34831 Tributary (branch) retinal vein occlusion, right eye, with macular edema: Secondary | ICD-10-CM | POA: Diagnosis not present

## 2022-02-02 DIAGNOSIS — H353132 Nonexudative age-related macular degeneration, bilateral, intermediate dry stage: Secondary | ICD-10-CM

## 2022-02-02 DIAGNOSIS — H25813 Combined forms of age-related cataract, bilateral: Secondary | ICD-10-CM

## 2022-02-02 DIAGNOSIS — H43813 Vitreous degeneration, bilateral: Secondary | ICD-10-CM

## 2022-02-02 DIAGNOSIS — H35412 Lattice degeneration of retina, left eye: Secondary | ICD-10-CM

## 2022-02-02 DIAGNOSIS — H33302 Unspecified retinal break, left eye: Secondary | ICD-10-CM | POA: Diagnosis not present

## 2022-02-02 MED ORDER — BEVACIZUMAB CHEMO INJECTION 1.25MG/0.05ML SYRINGE FOR KALEIDOSCOPE
1.2500 mg | INTRAVITREAL | Status: AC | PRN
  Administered 2022-02-02: 1.25 mg via INTRAVITREAL

## 2022-03-10 NOTE — Progress Notes (Shared)
[facility] Clinic Note  03/23/2022     CHIEF COMPLAINT Patient presents for No chief complaint on file.   HISTORY OF PRESENT ILLNESS: Sara Glover is a 65 y.o. female who presents to the clinic today for:     Pt feels like right eye vision is doing good  Referring physician: Tower, Wynelle Fanny, MD [place],  [place] [address]  HISTORICAL INFORMATION:   Selected notes from the MEDICAL RECORD NUMBER Referred by Dr. Ellin Mayhew for concern of BRVO LEE:  Ocular Hx- PMH-    CURRENT MEDICATIONS: No current outpatient medications on file. (Ophthalmic Drugs)   No current facility-administered medications for this visit. (Ophthalmic Drugs)   No current outpatient medications on file. (Other)   No current facility-administered medications for this visit. (Other)   REVIEW OF SYSTEMS:    ALLERGIES Allergies  Allergen Reactions   Metformin     REACTION: diarrhea   PAST MEDICAL HISTORY Past Medical History:  Diagnosis Date   Diabetes mellitus 2004   type II   Hyperlipidemia 2004   Hypertension 2004   Morbid obesity ([place])    Sleep apnea    Past Surgical History:  Procedure Laterality Date   bariactric surgery     CESAREAN SECTION     CHOLECYSTECTOMY     FAMILY HISTORY Family History  Problem Relation Age of Onset   Diabetes Mother    Cancer Mother        colon CA   Schizophrenia Mother    Diabetes Father    Cancer Sister        ovarian CA in 20s   Diabetes Brother    SOCIAL HISTORY Social History   Tobacco Use   Smoking status: Never   Smokeless tobacco: Never  Vaping Use   Vaping Use: Never used  Substance Use Topics   Alcohol use: No   Drug use: No       OPHTHALMIC EXAM:  Not recorded    IMAGING AND PROCEDURES  Imaging and Procedures for 03/23/2022          ASSESSMENT/PLAN:    ICD-10-CM   1. Branch retinal vein occlusion of right eye with macular edema  H34.8310     2. Lattice  degeneration of left retina  H35.412     3. Left retinal defect  H33.302     4. Posterior vitreous detachment of both eyes  H43.813     5. Intermediate stage nonexudative age-related macular degeneration of both eyes  H35.3132     6. Combined forms of age-related cataract of both eyes  H25.813       1. BRVO w/ CME OD  - f/u delayed to 9 wks instead of 8  - s/p IVA OD #1 (02.14.23), #2 (03.14.23), #3 (04.11.23), #4 (05.26.23), #5 (06.23.23), #6 (07.28.23), #7 (09.11.23), #8 (11.14.23) - BCVA 20/25 -- stable   - OCT shows Interval increase in IRF/edema IN fovea and macula, persistent IRHM at 9 weeks - recommend IVA OD #9 today, 1.02.24 w/ f/u back to 7 wks - pt wishes to proceed with IVA OD - RBA of procedure discussed, questions answered - informed consent obtained and signed - see procedure note  - F/U 7 weeks -- DFE/OCT/possible injection  2,3. Lattice degeneration w/ atrophic holes, left eye - pigmented lattice at 1230 with retinal holes - s/p laser retinopexy OS (02.21.23) - good laser surrounding - monitor  4. PVD / vitreous syneresis OU  -  OD with stable release of VMA to full PVD -- symptomatic flashes and floaters improved  - OS - stable PVD  - Discussed findings and prognosis  - No RT or RD on 360 exam OU  - Reviewed s/s of RT/RD  - strict return precautions for any such RT/RD symptoms  5. Age related macular degeneration, non-exudative, both eyes  - The incidence, anatomy, and pathology of dry AMD, risk of progression, and the AREDS and AREDS 2 study including smoking risks discussed with patient.   - continue Amsler grid monitoring  6. Mixed Cataract OU - The symptoms of cataract, surgical options, and treatments and risks were discussed with patient. - discussed diagnosis and progression  - monitor  Ophthalmic Meds Ordered this visit:  No orders of the defined types were placed in this encounter.    No follow-ups on file.  There are no Patient  Instructions on file for this visit.   Explained the diagnoses, plan, and follow up with the patient and they expressed understanding.  Patient expressed understanding of the importance of proper follow up care.   This document serves as a record of services personally performed by Karie Chimera, MD, PhD. It was created on their behalf by Gerilyn Nestle, COT an ophthalmic technician. The creation of this record is the provider's dictation and/or activities during the visit.    Electronically signed by:  Gerilyn Nestle, COT  12.20.23 11:05 AM    Karie Chimera, M.D., Ph.D. Diseases & Surgery of the Retina and Vitreous [facility]   Abbreviations: M myopia (nearsighted); A astigmatism; H hyperopia (farsighted); P presbyopia; Mrx spectacle prescription;  CTL contact lenses; OD right eye; OS left eye; OU both eyes  XT exotropia; ET esotropia; PEK punctate epithelial keratitis; PEE punctate epithelial erosions; DES dry eye syndrome; MGD meibomian gland dysfunction; ATs artificial tears; PFAT's preservative free artificial tears; NSC nuclear sclerotic cataract; PSC posterior subcapsular cataract; ERM epi-retinal membrane; PVD posterior vitreous detachment; RD retinal detachment; DM diabetes mellitus; DR diabetic retinopathy; NPDR non-proliferative diabetic retinopathy; PDR proliferative diabetic retinopathy; CSME clinically significant macular edema; DME diabetic macular edema; dbh dot blot hemorrhages; CWS cotton wool spot; POAG primary open angle glaucoma; C/D cup-to-disc ratio; HVF humphrey visual field; GVF goldmann visual field; OCT optical coherence tomography; IOP intraocular pressure; BRVO Branch retinal vein occlusion; CRVO central retinal vein occlusion; CRAO central retinal artery occlusion; BRAO branch retinal artery occlusion; RT retinal tear; SB scleral buckle; PPV pars plana vitrectomy; VH Vitreous hemorrhage; PRP panretinal laser photocoagulation; IVK  intravitreal kenalog; VMT vitreomacular traction; MH Macular hole;  NVD neovascularization of the disc; NVE neovascularization elsewhere; AREDS age related eye disease study; ARMD age related macular degeneration; POAG primary open angle glaucoma; EBMD epithelial/anterior basement membrane dystrophy; ACIOL anterior chamber intraocular lens; IOL intraocular lens; PCIOL posterior chamber intraocular lens; Phaco/IOL phacoemulsification with intraocular lens placement; PRK photorefractive keratectomy; LASIK laser assisted in situ keratomileusis; HTN hypertension; DM diabetes mellitus; COPD chronic obstructive pulmonary disease

## 2022-03-18 NOTE — Progress Notes (Signed)
[facility] Note  03/19/2022     CHIEF COMPLAINT Patient presents for Retina Follow Up   HISTORY OF PRESENT ILLNESS: Sara Glover is a 66 y.o. female who presents to the clinic today for:   HPI     Retina Follow Up   Patient presents with  CRVO/BRVO.  In right eye.  This started 7 weeks ago.  I, the attending physician,  performed the HPI with the patient and updated documentation appropriately.        Comments   Patient here for 7 weeks retina follow up for BRVO OD. Patient states vision doing pretty good. No eye pain. Has a leftover cough. Not using drops.      Last edited by Bernarda Caffey, MD on 03/19/2022  3:42 PM.     Referring physician: Tower, Wynelle Fanny, MD [place],  [place] [id number]  HISTORICAL INFORMATION:   Selected notes from the MEDICAL RECORD NUMBER Referred by Dr. Ellin Mayhew for concern of BRVO LEE:  Ocular Hx- PMH-    CURRENT MEDICATIONS: No current outpatient medications on file. (Ophthalmic Drugs)   No current facility-administered medications for this visit. (Ophthalmic Drugs)   No current outpatient medications on file. (Other)   No current facility-administered medications for this visit. (Other)   REVIEW OF SYSTEMS: ROS   Positive for: Cardiovascular, Eyes, Respiratory Negative for: Constitutional, Gastrointestinal, Neurological, Skin, Genitourinary, Musculoskeletal, HENT, Endocrine, Psychiatric, Allergic/Imm, Heme/Lymph Last edited by Theodore Demark, COA on 03/19/2022  1:36 PM.       ALLERGIES Allergies  Allergen Reactions   Metformin     REACTION: diarrhea   PAST MEDICAL HISTORY Past Medical History:  Diagnosis Date   Diabetes mellitus 2004   type II   Hyperlipidemia 2004   Hypertension 2004   Morbid obesity ([place])    Sleep apnea    Past Surgical History:  Procedure Laterality Date   bariactric surgery     CESAREAN SECTION     CHOLECYSTECTOMY     FAMILY  HISTORY Family History  Problem Relation Age of Onset   Diabetes Mother    Cancer Mother        colon CA   Schizophrenia Mother    Diabetes Father    Cancer Sister        ovarian CA in 26s   Diabetes Brother    SOCIAL HISTORY Social History   Tobacco Use   Smoking status: Never   Smokeless tobacco: Never  Vaping Use   Vaping Use: Never used  Substance Use Topics   Alcohol use: No   Drug use: No       OPHTHALMIC EXAM:  Base Eye Exam     Visual Acuity (Snellen - Linear)       Right Left   Dist cc 20/30 -2 20/25 +2   Dist ph cc 20/25 -1 NI    Correction: Glasses         Tonometry (Tonopen, 1:34 PM)       Right Left   Pressure 19 17         Pupils       Dark Light Shape React APD   Right 3 2 Round Brisk None   Left 3 2 Round Brisk None         Visual Fields (Counting fingers)       Left Right    Full Full         Extraocular Movement  Right Left    Full, Ortho Full, Ortho         Neuro/Psych     Oriented x3: Yes   Mood/Affect: Normal         Dilation     Both eyes: 1.0% Mydriacyl, 2.5% Phenylephrine @ 1:34 PM           Slit Lamp and Fundus Exam     Slit Lamp Exam       Right Left   Lids/Lashes Dermatochalasis - upper lid, mild MGD Dermatochalasis - upper lid, mild MGD   Conjunctiva/Sclera White and quiet Mild temporal pinguecula   Cornea Mild tear film debris Mild tear film debris   Anterior Chamber Deep and quiet Deep and quiet   Iris Round and dilated Round and dilated   Lens 2-3+ Nuclear sclerosis with early brunescence, 2+ Cortical cataract 2-3+ Nuclear sclerosis with mild brunescence, 2+ Cortical cataract   Anterior Vitreous Mild Vitreous syneresis, Posterior vitreous detachment, Vitreous condensations Mild Vitreous syneresis, Posterior vitreous detachment, vitreous condensations         Fundus Exam       Right Left   Disc Pink and Sharp Pink and Sharp, PPA/PPP   C/D Ratio 0.2 0.3   Macula Blunted  foveal reflex, cluster of flame hemes and DBH inferior macula -- almost resolved, interval improvement in edema IN mac and fovea, residual CWS inferior macula Flat, Blunted foveal reflex, RPE mottling, No heme or edema   Vessels attenuated, Tortuous, IT BRVO attenuated, mild tortuosity   Periphery Attached, focal cluster of drusen at 0200 equator, No heme, No RT/RD Attached, pigmented lattice at 0100 with retinal holes - good laser surrounding, no new RT/RD/Lattice           Refraction     Wearing Rx       Sphere Cylinder Axis Add   Right -1.75 +0.75 008 +1.75   Left -2.25 +0.50 137 +1.75           IMAGING AND PROCEDURES  Imaging and Procedures for 03/19/2022  OCT, Retina - OU - Both Eyes       Right Eye Quality was good. Central Foveal Thickness: 234. Progression has improved. Findings include normal foveal contour, no SRF, retinal drusen , intraretinal hyper-reflective material, intraretinal fluid (Interval improvement in foveal contour and interval resolution of central IRF, mild residual IRF IN macula).   Left Eye Quality was good. Central Foveal Thickness: 248. Progression has been stable. Findings include normal foveal contour, no IRF, no SRF, myopic contour, retinal drusen (Focal cluster of drusen ST macula).   Notes *Images captured and stored on drive  Diagnosis / Impression:  OD: inferior BRVO with CME -- Interval improvement in foveal contour and interval resolution of central IRF, mild residual IRF IN macula OS: non-exu ARMD   Clinical management:  See below  Abbreviations: NFP - Normal foveal profile. CME - cystoid macular edema. PED - pigment epithelial detachment. IRF - intraretinal fluid. SRF - subretinal fluid. EZ - ellipsoid zone. ERM - epiretinal membrane. ORA - outer retinal atrophy. ORT - outer retinal tubulation. SRHM - subretinal hyper-reflective material. IRHM - intraretinal hyper-reflective material      Intravitreal Injection, Pharmacologic  Agent - OD - Right Eye       Time Out 03/19/2022. 1:55 PM. Confirmed correct patient, procedure, site, and patient consented.   Anesthesia Topical anesthesia was used. Anesthetic medications included Lidocaine 2%, Proparacaine 0.5%.   Procedure Preparation included 5% betadine to ocular surface, eyelid speculum.  A supplied (32g) needle was used.   Injection: 1.25 mg Bevacizumab 1.72m/0.05ml   Route: Intravitreal, Site: Right Eye   NDC:: 98119-147-82 Lot: [id number] , Expiration date: 04/07/2022   Post-op Post injection exam found visual acuity of at least counting fingers. The patient tolerated the procedure well. There were no complications. The patient received written and verbal post procedure care education. Post injection medications were not given.            ASSESSMENT/PLAN:    ICD-10-CM   1. Branch retinal vein occlusion of right eye with macular edema  H34.8310 OCT, Retina - OU - Both Eyes    Intravitreal Injection, Pharmacologic Agent - OD - Right Eye    Bevacizumab (AVASTIN) SOLN 1.25 mg    2. Left retinal defect  H33.302     3. Posterior vitreous detachment of both eyes  H43.813     4. Lattice degeneration of left retina  H35.412     5. Intermediate stage nonexudative age-related macular degeneration of both eyes  H35.3132     6. Combined forms of age-related cataract of both eyes  H25.813      1. BRVO w/ CME OD  - s/p IVA OD #1 (02.14.23), #2 (03.14.23), #3 (04.11.23), #4 (05.26.23), #5 (06.23.23), #6 (07.28.23), #7 (09.11.23), #8 (11.14.23) - BCVA 20/25 -- stable   - OCT shows Interval improvement in foveal contour and interval resolution of central IRF, mild residual IRF IN macula at 6 weeks - recommend IVA OD #9 today, 12.29.23 w/ f/u at 6 wks again - pt wishes to proceed with IVA OD - RBA of procedure discussed, questions answered - informed consent obtained and signed - see procedure note  - F/U 6 weeks -- DFE/OCT/possible injection  2,3. Lattice  degeneration w/ atrophic holes, left eye - pigmented lattice at 1230 with retinal holes - s/p laser retinopexy OS (02.21.23) - good laser surrounding - monitor  4. PVD / vitreous syneresis OU  - OD with stable release of VMA to full PVD -- symptomatic flashes and floaters improved  - OS - stable PVD  - Discussed findings and prognosis  - No RT or RD on 360 exam OU  - Reviewed s/s of RT/RD  - strict return precautions for any such RT/RD symptoms  5. Age related macular degeneration, non-exudative, both eyes  - The incidence, anatomy, and pathology of dry AMD, risk of progression, and the AREDS and AREDS 2 study including smoking risks discussed with patient.   - continue Amsler grid monitoring  6. Mixed Cataract OU - The symptoms of cataract, surgical options, and treatments and risks were discussed with patient. - discussed diagnosis and progression  - monitor  Ophthalmic Meds Ordered this visit:  Meds ordered this encounter  Medications   Bevacizumab (AVASTIN) SOLN 1.25 mg     Return in about 6 weeks (around 04/30/2022) for f/u BRVO OD, DFE, OCT.  There are no Patient Instructions on file for this visit.   Explained the diagnoses, plan, and follow up with the patient and they expressed understanding.  Patient expressed understanding of the importance of proper follow up care.   This document serves as a record of services personally performed by BGardiner Sleeper MD, PhD. It was created on their behalf by MOrvan Falconer an ophthalmic technician. The creation of this record is the provider's dictation and/or activities during the visit.    Electronically signed by: MOrvan Falconer OA, 03/19/22  3:42 PM  This document serves as a record  of services personally performed by Gardiner Sleeper, MD, PhD. It was created on their behalf by San Jetty. Owens Shark, OA an ophthalmic technician. The creation of this record is the provider's dictation and/or activities during the visit.     Electronically signed by: San Jetty. Owens Shark, [place] 12.29.2023 3:42 PM  Gardiner Sleeper, M.D., Ph.D. Diseases & Surgery of the Retina and Vitreous [facility]  I have reviewed the above documentation for accuracy and completeness, and I agree with the above. Gardiner Sleeper, M.D., Ph.D. 03/19/22 3:44 PM   Abbreviations: M myopia (nearsighted); A astigmatism; H hyperopia (farsighted); P presbyopia; Mrx spectacle prescription;  CTL contact lenses; OD right eye; OS left eye; OU both eyes  XT exotropia; ET esotropia; PEK punctate epithelial keratitis; PEE punctate epithelial erosions; DES dry eye syndrome; MGD meibomian gland dysfunction; ATs artificial tears; PFAT's preservative free artificial tears; [place] nuclear sclerotic cataract; PSC posterior subcapsular cataract; ERM epi-retinal membrane; PVD posterior vitreous detachment; RD retinal detachment; DM diabetes mellitus; DR diabetic retinopathy; NPDR non-proliferative diabetic retinopathy; PDR proliferative diabetic retinopathy; CSME clinically significant macular edema; DME diabetic macular edema; dbh dot blot hemorrhages; CWS cotton wool spot; POAG primary open angle glaucoma; C/D cup-to-disc ratio; HVF humphrey visual field; GVF goldmann visual field; OCT optical coherence tomography; IOP intraocular pressure; BRVO Branch retinal vein occlusion; CRVO central retinal vein occlusion; CRAO central retinal artery occlusion; BRAO branch retinal artery occlusion; RT retinal tear; SB scleral buckle; PPV pars plana vitrectomy; VH Vitreous hemorrhage; PRP panretinal laser photocoagulation; IVK intravitreal kenalog; VMT vitreomacular traction; MH Macular hole;  NVD neovascularization of the disc; NVE neovascularization elsewhere; AREDS age related eye disease study; ARMD age related macular degeneration; POAG primary open angle glaucoma; EBMD epithelial/anterior basement membrane dystrophy; ACIOL anterior chamber intraocular lens; IOL  intraocular lens; PCIOL posterior chamber intraocular lens; Phaco/IOL phacoemulsification with intraocular lens placement; [place] photorefractive keratectomy; LASIK laser assisted in situ keratomileusis; HTN hypertension; DM diabetes mellitus; COPD chronic obstructive pulmonary disease

## 2022-03-19 ENCOUNTER — Encounter (INDEPENDENT_AMBULATORY_CARE_PROVIDER_SITE_OTHER): Payer: Self-pay | Admitting: Ophthalmology

## 2022-03-19 ENCOUNTER — Ambulatory Visit (INDEPENDENT_AMBULATORY_CARE_PROVIDER_SITE_OTHER): Payer: BC Managed Care – PPO | Admitting: Ophthalmology

## 2022-03-19 DIAGNOSIS — H35412 Lattice degeneration of retina, left eye: Secondary | ICD-10-CM | POA: Diagnosis not present

## 2022-03-19 DIAGNOSIS — H353132 Nonexudative age-related macular degeneration, bilateral, intermediate dry stage: Secondary | ICD-10-CM

## 2022-03-19 DIAGNOSIS — H33302 Unspecified retinal break, left eye: Secondary | ICD-10-CM | POA: Diagnosis not present

## 2022-03-19 DIAGNOSIS — H25813 Combined forms of age-related cataract, bilateral: Secondary | ICD-10-CM

## 2022-03-19 DIAGNOSIS — H34831 Tributary (branch) retinal vein occlusion, right eye, with macular edema: Secondary | ICD-10-CM

## 2022-03-19 DIAGNOSIS — H43813 Vitreous degeneration, bilateral: Secondary | ICD-10-CM | POA: Diagnosis not present

## 2022-03-19 MED ORDER — BEVACIZUMAB CHEMO INJECTION 1.25MG/0.05ML SYRINGE FOR KALEIDOSCOPE
1.2500 mg | INTRAVITREAL | Status: AC | PRN
  Administered 2022-03-19: 1.25 mg via INTRAVITREAL

## 2022-03-23 ENCOUNTER — Encounter (INDEPENDENT_AMBULATORY_CARE_PROVIDER_SITE_OTHER): Payer: BC Managed Care – PPO | Admitting: Ophthalmology

## 2022-03-23 DIAGNOSIS — H353132 Nonexudative age-related macular degeneration, bilateral, intermediate dry stage: Secondary | ICD-10-CM

## 2022-03-23 DIAGNOSIS — H33302 Unspecified retinal break, left eye: Secondary | ICD-10-CM

## 2022-03-23 DIAGNOSIS — H34831 Tributary (branch) retinal vein occlusion, right eye, with macular edema: Secondary | ICD-10-CM

## 2022-03-23 DIAGNOSIS — H35412 Lattice degeneration of retina, left eye: Secondary | ICD-10-CM

## 2022-03-23 DIAGNOSIS — H25813 Combined forms of age-related cataract, bilateral: Secondary | ICD-10-CM

## 2022-03-23 DIAGNOSIS — H43813 Vitreous degeneration, bilateral: Secondary | ICD-10-CM

## 2022-04-27 NOTE — Progress Notes (Signed)
[facility] Note  05/03/2022     CHIEF COMPLAINT Patient presents for Retina Follow Up   HISTORY OF PRESENT ILLNESS: Sara Glover is a 67 y.o. female who presents to the clinic today for:   HPI     Retina Follow Up   Patient presents with  CRVO/BRVO.  In right eye.  This started 6 weeks ago.  Duration of 6 weeks.  Since onset it is stable.  I, the attending physician,  performed the HPI with the patient and updated documentation appropriately.        Comments   6 week retina follow up CRVO OD and IVA OD pt states vision seems little better she has noticed one floaters not noticed as much       Last edited by Bernarda Caffey, MD on 05/03/2022  5:00 PM.    Pt feels like vision is about the same  Referring physician: Anell Barr, [place],  [place] [id number]  HISTORICAL INFORMATION:   Selected notes from the [place] Referred by Dr. Ellin Mayhew for concern of BRVO LEE:  Ocular Hx- PMH-    CURRENT MEDICATIONS: No current outpatient medications on file. (Ophthalmic Drugs)   No current facility-administered medications for this visit. (Ophthalmic Drugs)   No current outpatient medications on file. (Other)   No current facility-administered medications for this visit. (Other)   REVIEW OF SYSTEMS: ROS   Positive for: Cardiovascular, Eyes, Respiratory Negative for: Constitutional, Gastrointestinal, Neurological, Skin, Genitourinary, Musculoskeletal, HENT, Endocrine, Psychiatric, Allergic/Imm, Heme/Lymph Last edited by Parthenia Ames, COT on 05/03/2022  2:55 PM.     ALLERGIES Allergies  Allergen Reactions   Metformin     REACTION: diarrhea   PAST MEDICAL HISTORY Past Medical History:  Diagnosis Date   Diabetes mellitus 2004   type II   Hyperlipidemia 2004   Hypertension 2004   Morbid obesity ([place])    Sleep apnea    Past Surgical History:  Procedure Laterality Date   bariactric surgery      CESAREAN SECTION     CHOLECYSTECTOMY     FAMILY HISTORY Family History  Problem Relation Age of Onset   Diabetes Mother    Cancer Mother        colon CA   Schizophrenia Mother    Diabetes Father    Cancer Sister        ovarian CA in 36s   Diabetes Brother    SOCIAL HISTORY Social History   Tobacco Use   Smoking status: Never   Smokeless tobacco: Never  Vaping Use   Vaping Use: Never used  Substance Use Topics   Alcohol use: No   Drug use: No       OPHTHALMIC EXAM:  Base Eye Exam     Visual Acuity (Snellen - Linear)       Right Left   Dist cc 20/40 20/30   Dist ph cc 20/40 +1 20/30 +1    Correction: Glasses         Tonometry (Tonopen, 2:59 PM)       Right Left   Pressure 15 17         Pupils       Pupils Dark Light Shape React APD   Right PERRL 3 2 Round Brisk None   Left PERRL 3 2 Round Brisk None         Visual Fields       Left Right  Full Full         Extraocular Movement       Right Left    Full, Ortho Full, Ortho         Neuro/Psych     Oriented x3: Yes   Mood/Affect: Normal         Dilation     Both eyes: 2.5% Phenylephrine @ 2:57 PM           Slit Lamp and Fundus Exam     Slit Lamp Exam       Right Left   Lids/Lashes Dermatochalasis - upper lid, mild MGD Dermatochalasis - upper lid, mild MGD   Conjunctiva/Sclera White and quiet Mild temporal pinguecula   Cornea trace PEE Mild tear film debris   Anterior Chamber Deep and quiet Deep and quiet   Iris Round and dilated Round and dilated   Lens 3+ Nuclear sclerosis with early brunescence, 2-3+ Cortical cataract 3+ Nuclear sclerosis with mild brunescence, 2-3+ Cortical cataract   Anterior Vitreous Mild Vitreous syneresis, Posterior vitreous detachment, Vitreous condensations Mild Vitreous syneresis, Posterior vitreous detachment, vitreous condensations         Fundus Exam       Right Left   Disc Pink and Sharp Pink and Sharp, PPA/PPP   C/D Ratio  0.2 0.3   Macula Blunted foveal reflex, cluster of flame hemes and DBH inferior macula, interval improvement in edema IN mac and fovea, residual CWS inferior macula Flat, Blunted foveal reflex, RPE mottling, No heme or edema   Vessels attenuated, Tortuous, IT BRVO attenuated, mild tortuosity   Periphery Attached, focal cluster of drusen at 0200 equator, No heme, No RT/RD Attached, pigmented lattice at 0100 with retinal holes - good laser surrounding, no new RT/RD/Lattice           Refraction     Wearing Rx       Sphere Cylinder Axis Add   Right -1.75 +0.75 008 +1.75   Left -2.25 +0.50 137 +1.75           IMAGING AND PROCEDURES  Imaging and Procedures for 05/03/2022  OCT, Retina - OU - Both Eyes       Right Eye Quality was good. Central Foveal Thickness: 230. Progression has been stable. Findings include normal foveal contour, no SRF, retinal drusen , intraretinal hyper-reflective material, intraretinal fluid (stable improvement in foveal contour and resolution of central IRF, mild residual IRF and edema IN macula).   Left Eye Quality was good. Central Foveal Thickness: 248. Progression has been stable. Findings include normal foveal contour, no IRF, no SRF, myopic contour, retinal drusen (Focal cluster of drusen ST macula).   Notes *Images captured and stored on drive  Diagnosis / Impression:  OD: inferior BRVO with CME -- stable improvement in foveal contour and resolution of central IRF, mild residual IRF and edema IN macula OS: non-exu ARMD   Clinical management:  See below  Abbreviations: NFP - Normal foveal profile. CME - cystoid macular edema. PED - pigment epithelial detachment. IRF - intraretinal fluid. SRF - subretinal fluid. EZ - ellipsoid zone. ERM - epiretinal membrane. ORA - outer retinal atrophy. ORT - outer retinal tubulation. SRHM - subretinal hyper-reflective material. IRHM - intraretinal hyper-reflective material      Intravitreal Injection,  Pharmacologic Agent - OD - Right Eye       Time Out 05/03/2022. 3:13 PM. Confirmed correct patient, procedure, site, and patient consented.   Anesthesia Topical anesthesia was used. Anesthetic medications included Lidocaine 2%,  Proparacaine 0.5%.   Procedure Preparation included 5% betadine to ocular surface, eyelid speculum. A supplied (32g) needle was used.   Injection: 1.25 mg Bevacizumab 1.39m/0.05ml   Route: Intravitreal, Site: Right Eye   NDC:[id number] Lot: [id number] , Expiration date: 06/04/2022   Post-op Post injection exam found visual acuity of at least counting fingers. The patient tolerated the procedure well. There were no complications. The patient received written and verbal post procedure care education. Post injection medications were not given.            ASSESSMENT/PLAN:    ICD-10-CM   1. Branch retinal vein occlusion of right eye with macular edema  H34.8310 OCT, Retina - OU - Both Eyes    Intravitreal Injection, Pharmacologic Agent - OD - Right Eye    Bevacizumab (AVASTIN) SOLN 1.25 mg    2. Posterior vitreous detachment of both eyes  H43.813     3. Left retinal defect  H33.302     4. Lattice degeneration of left retina  H35.412     5. Intermediate stage nonexudative age-related macular degeneration of both eyes  H35.3132     6. Combined forms of age-related cataract of both eyes  H25.813      1. BRVO w/ CME OD  - s/p IVA OD #1 (02.14.23), #2 (03.14.23), #3 (04.11.23), #4 (05.26.23), #5 (06.23.23), #6 (07.28.23), #7 (09.11.23), #8 (11.14.23), #9 (12.29.23) - BCVA 20/40 from 20/25 -- suspect mostly cataract progression (see below) - OCT shows stable improvement in foveal contour and resolution of central IRF, mild residual IRF and edema IN macula at 6 weeks - recommend IVA OD #10 today, 02.12.24 w/ extension to 7 wks - pt wishes to proceed with IVA OD - RBA of procedure discussed, questions answered - informed consent obtained and signed -  see procedure note  - F/U 7 weeks -- DFE/OCT/possible injection  2,3. Lattice degeneration w/ atrophic holes, left eye - pigmented lattice at 1230 with retinal holes - s/p laser retinopexy OS (02.21.23) - good laser surrounding - monitor  4. PVD / vitreous syneresis OU  - OD with stable release of VMA to full PVD -- symptomatic flashes and floaters improved  - OS - stable PVD  - Discussed findings and prognosis  - No RT or RD on 360 exam OU  - Reviewed s/s of RT/RD  - strict return precautions for any such RT/RD symptoms  5. Age related macular degeneration, non-exudative, both eyes  - The incidence, anatomy, and pathology of dry AMD, risk of progression, and the AREDS and AREDS 2 study including smoking risks discussed with patient.   - continue Amsler grid monitoring  6. Mixed Cataract OU - The symptoms of cataract, surgical options, and treatments and risks were discussed with patient. - discussed diagnosis and progression - BCVA decreased OU and exam shows progression of cataracts  - recommend cataract eval, but will defer referral to Dr. [id number]-- pt to contact Dr. WEllin Mayhewoffice for recommendation / referral  Ophthalmic Meds Ordered this visit:  Meds ordered this encounter  Medications   Bevacizumab (AVASTIN) SOLN 1.25 mg     Return in about 7 weeks (around 06/21/2022) for f/u BRVO OD, DFE, OCT.  There are no Patient Instructions on file for this visit.   Explained the diagnoses, plan, and follow up with the patient and they expressed understanding.  Patient expressed understanding of the importance of proper follow up care.   This document serves as a record of services personally performed by  Gardiner Sleeper, MD, PhD. It was created on their behalf by Orvan Falconer, an ophthalmic technician. The creation of this record is the provider's dictation and/or activities during the visit.    Electronically signed by: Orvan Falconer, OA, 05/03/22  5:10 PM  This  document serves as a record of services personally performed by Gardiner Sleeper, MD, PhD. It was created on their behalf by San Jetty. Owens Shark, OA an ophthalmic technician. The creation of this record is the provider's dictation and/or activities during the visit.    Electronically signed by: San Jetty. Owens Shark, [place] 02.12.2024 5:10 PM  Gardiner Sleeper, M.D., Ph.D. Diseases & Surgery of the Retina and Vitreous [facility]  I have reviewed the above documentation for accuracy and completeness, and I agree with the above. Gardiner Sleeper, M.D., Ph.D. 05/03/22 5:13 PM   Abbreviations: M myopia (nearsighted); A astigmatism; H hyperopia (farsighted); P presbyopia; Mrx spectacle prescription;  CTL contact lenses; OD right eye; OS left eye; OU both eyes  XT exotropia; ET esotropia; PEK punctate epithelial keratitis; PEE punctate epithelial erosions; DES dry eye syndrome; MGD meibomian gland dysfunction; ATs artificial tears; PFAT's preservative free artificial tears; [place] nuclear sclerotic cataract; PSC posterior subcapsular cataract; ERM epi-retinal membrane; PVD posterior vitreous detachment; RD retinal detachment; DM diabetes mellitus; DR diabetic retinopathy; NPDR non-proliferative diabetic retinopathy; PDR proliferative diabetic retinopathy; CSME clinically significant macular edema; DME diabetic macular edema; dbh dot blot hemorrhages; CWS cotton wool spot; POAG primary open angle glaucoma; C/D cup-to-disc ratio; HVF humphrey visual field; GVF goldmann visual field; OCT optical coherence tomography; IOP intraocular pressure; BRVO Branch retinal vein occlusion; CRVO central retinal vein occlusion; CRAO central retinal artery occlusion; BRAO branch retinal artery occlusion; RT retinal tear; SB scleral buckle; PPV pars plana vitrectomy; VH Vitreous hemorrhage; PRP panretinal laser photocoagulation; IVK intravitreal kenalog; VMT vitreomacular traction; MH Macular hole;  NVD neovascularization of  the disc; NVE neovascularization elsewhere; AREDS age related eye disease study; ARMD age related macular degeneration; POAG primary open angle glaucoma; EBMD epithelial/anterior basement membrane dystrophy; ACIOL anterior chamber intraocular lens; IOL intraocular lens; PCIOL posterior chamber intraocular lens; Phaco/IOL phacoemulsification with intraocular lens placement; [place] photorefractive keratectomy; LASIK laser assisted in situ keratomileusis; HTN hypertension; DM diabetes mellitus; COPD chronic obstructive pulmonary disease

## 2022-05-03 ENCOUNTER — Encounter (INDEPENDENT_AMBULATORY_CARE_PROVIDER_SITE_OTHER): Payer: Self-pay | Admitting: Ophthalmology

## 2022-05-03 ENCOUNTER — Ambulatory Visit (INDEPENDENT_AMBULATORY_CARE_PROVIDER_SITE_OTHER): Payer: BC Managed Care – PPO | Admitting: Ophthalmology

## 2022-05-03 DIAGNOSIS — H43813 Vitreous degeneration, bilateral: Secondary | ICD-10-CM

## 2022-05-03 DIAGNOSIS — H34831 Tributary (branch) retinal vein occlusion, right eye, with macular edema: Secondary | ICD-10-CM | POA: Diagnosis not present

## 2022-05-03 DIAGNOSIS — H35412 Lattice degeneration of retina, left eye: Secondary | ICD-10-CM

## 2022-05-03 DIAGNOSIS — H33302 Unspecified retinal break, left eye: Secondary | ICD-10-CM | POA: Diagnosis not present

## 2022-05-03 DIAGNOSIS — H353132 Nonexudative age-related macular degeneration, bilateral, intermediate dry stage: Secondary | ICD-10-CM

## 2022-05-03 DIAGNOSIS — H25813 Combined forms of age-related cataract, bilateral: Secondary | ICD-10-CM

## 2022-05-03 MED ORDER — BEVACIZUMAB CHEMO INJECTION 1.25MG/0.05ML SYRINGE FOR KALEIDOSCOPE
1.2500 mg | INTRAVITREAL | Status: AC | PRN
  Administered 2022-05-03: 1.25 mg via INTRAVITREAL

## 2022-06-10 NOTE — Progress Notes (Signed)
[facility] Note  06/22/2022     CHIEF COMPLAINT Patient presents for Retina Follow Up   HISTORY OF PRESENT ILLNESS: Sara Glover is a 67 y.o. female who presents to the clinic today for:   HPI     Retina Follow Up   Patient presents with  CRVO/BRVO.  In right eye.  This started 7 weeks ago.  Duration of 7 weeks.  Since onset it is stable.  I, the attending physician,  performed the HPI with the patient and updated documentation appropriately.        Comments   7 week retina follow up BRVO OD and IVA OD pt is reporting no vision changes noticed she has noticed a few floaters but denies flashes she is having cataract surgery April 12 with Dr Nancy Fetter       Last edited by Bernarda Caffey, MD on 06/22/2022  3:53 PM.    Pt has cataract eval with Dr. Nancy Fetter scheduled for April 12  Referring physician: Anell Barr, [place],  [place] [address]  HISTORICAL INFORMATION:   Selected notes from the MEDICAL RECORD NUMBER Referred by Dr. Ellin Mayhew for concern of BRVO LEE:  Ocular Hx- PMH-    CURRENT MEDICATIONS: No current outpatient medications on file. (Ophthalmic Drugs)   No current facility-administered medications for this visit. (Ophthalmic Drugs)   No current outpatient medications on file. (Other)   No current facility-administered medications for this visit. (Other)   REVIEW OF SYSTEMS: ROS   Positive for: Cardiovascular, Eyes, Respiratory Negative for: Constitutional, Gastrointestinal, Neurological, Skin, Genitourinary, Musculoskeletal, HENT, Endocrine, Psychiatric, Allergic/Imm, Heme/Lymph Last edited by Parthenia Ames, COT on 06/22/2022  2:43 PM.     ALLERGIES Allergies  Allergen Reactions   Metformin     REACTION: diarrhea   PAST MEDICAL HISTORY Past Medical History:  Diagnosis Date   Diabetes mellitus 2004   type II   Hyperlipidemia 2004   Hypertension 2004   Morbid obesity    Sleep apnea    Past  Surgical History:  Procedure Laterality Date   bariactric surgery     CESAREAN SECTION     CHOLECYSTECTOMY     FAMILY HISTORY Family History  Problem Relation Age of Onset   Diabetes Mother    Cancer Mother        colon CA   Schizophrenia Mother    Diabetes Father    Cancer Sister        ovarian CA in 27s   Diabetes Brother    SOCIAL HISTORY Social History   Tobacco Use   Smoking status: Never   Smokeless tobacco: Never  Vaping Use   Vaping Use: Never used  Substance Use Topics   Alcohol use: No   Drug use: No       OPHTHALMIC EXAM:  Base Eye Exam     Visual Acuity (Snellen - Linear)       Right Left   Dist cc 20/40 -3 20/30 -2   Dist ph cc NI NI    Correction: Glasses         Tonometry (Tonopen, 2:50 PM)       Right Left   Pressure 18 18         Pupils       Pupils Dark Light Shape React APD   Right PERRL 3 2 Round Brisk None   Left PERRL 3 2 Round Brisk None  Visual Fields       Left Right    Full Full         Extraocular Movement       Right Left    Full, Ortho Full, Ortho         Neuro/Psych     Oriented x3: Yes   Mood/Affect: Normal         Dilation     Both eyes: 2.5% Phenylephrine @ 2:43 PM           Slit Lamp and Fundus Exam     Slit Lamp Exam       Right Left   Lids/Lashes Dermatochalasis - upper lid, mild MGD Dermatochalasis - upper lid, mild MGD   Conjunctiva/Sclera White and quiet Mild temporal pinguecula   Cornea trace PEE Mild tear film debris   Anterior Chamber Deep and quiet Deep and quiet   Iris Round and dilated Round and dilated   Lens 3+ Nuclear sclerosis with early brunescence, 2-3+ Cortical cataract 3+ Nuclear sclerosis with mild brunescence, 2-3+ Cortical cataract   Anterior Vitreous Mild Vitreous syneresis, Posterior vitreous detachment, Vitreous condensations Mild Vitreous syneresis, Posterior vitreous detachment, vitreous condensations         Fundus Exam       Right  Left   Disc Pink and Sharp Pink and Sharp, PPA/PPP   C/D Ratio 0.2 0.3   Macula Blunted foveal reflex, cluster of flame hemes and DBH inferior macula -- improved, interval improvement in edema IN mac and fovea, residual CWS inferior macula Flat, Blunted foveal reflex, RPE mottling, No heme or edema   Vessels attenuated, Tortuous, IT BRVO attenuated, mild tortuosity   Periphery Attached, focal cluster of drusen at 0200 equator, No heme, No RT/RD Attached, pigmented lattice at 0100 with retinal holes - good laser surrounding, no new RT/RD/Lattice           Refraction     Wearing Rx       Sphere Cylinder Axis Add   Right -1.75 +0.75 008 +1.75   Left -2.25 +0.50 137 +1.75           IMAGING AND PROCEDURES  Imaging and Procedures for 06/22/2022  OCT, Retina - OU - Both Eyes       Right Eye Quality was good. Central Foveal Thickness: 230. Progression has improved. Findings include normal foveal contour, no SRF, retinal drusen , intraretinal hyper-reflective material, intraretinal fluid (stable improvement in foveal contour and resolution of central IRF, mild improvement in IRF and edema IN macula).   Left Eye Quality was good. Central Foveal Thickness: 249. Progression has been stable. Findings include normal foveal contour, no IRF, no SRF, myopic contour, retinal drusen (Focal cluster of drusen ST macula).   Notes *Images captured and stored on drive  Diagnosis / Impression:  OD: inferior BRVO with CME -- stable improvement in foveal contour and resolution of central IRF, mild improvement in IRF and edema IN macula OS: non-exu ARMD   Clinical management:  See below  Abbreviations: NFP - Normal foveal profile. CME - cystoid macular edema. PED - pigment epithelial detachment. IRF - intraretinal fluid. SRF - subretinal fluid. EZ - ellipsoid zone. ERM - epiretinal membrane. ORA - outer retinal atrophy. ORT - outer retinal tubulation. SRHM - subretinal hyper-reflective material.  IRHM - intraretinal hyper-reflective material      Intravitreal Injection, Pharmacologic Agent - OD - Right Eye       Time Out 06/22/2022. 3:28 PM. Confirmed correct patient, procedure,  site, and patient consented.   Anesthesia Topical anesthesia was used. Anesthetic medications included Lidocaine 2%, Proparacaine 0.5%.   Procedure Preparation included 5% betadine to ocular surface, eyelid speculum. A supplied (32g) needle was used.   Injection: 1.25 mg Bevacizumab 1.25mg /0.14ml   Route: Intravitreal, Site: Right Eye   NDC: [id number], LotEP:[id number], Expiration date: 08/07/2022   Post-op Post injection exam found visual acuity of at least counting fingers. The patient tolerated the procedure well. There were no complications. The patient received written and verbal post procedure care education. Post injection medications were not given.            ASSESSMENT/PLAN:    ICD-10-CM   1. Branch retinal vein occlusion of right eye with macular edema  H34.8310 OCT, Retina - OU - Both Eyes    Intravitreal Injection, Pharmacologic Agent - OD - Right Eye    Bevacizumab (AVASTIN) SOLN 1.25 mg    2. Posterior vitreous detachment of both eyes  H43.813     3. Left retinal defect  H33.302     4. Lattice degeneration of left retina  H35.412     5. Intermediate stage nonexudative age-related macular degeneration of both eyes  H35.3132     6. Combined forms of age-related cataract of both eyes  H25.813      1. BRVO w/ CME OD  - s/p IVA OD #1 (02.14.23), #2 (03.14.23), #3 (04.11.23), #4 (05.26.23), #5 (06.23.23), #6 (07.28.23), #7 (09.11.23), #8 (11.14.23), #9 (12.29.23), #10 (02.12.24) - BCVA 20/40 stable from 20/25 -- suspect mostly cataract progression (see below) - OCT shows OD: inferior BRVO with CME -- stable improvement in foveal contour and resolution of central IRF, mild improvement in IRF and edema IN macula at 7 weeks - recommend IVA OD #11 today, 04.02.24 w/ extension to 8  wks - pt wishes to proceed with IVA OD - RBA of procedure discussed, questions answered - informed consent obtained and signed - see procedure note  - F/U 8 weeks -- DFE/OCT/possible injection  2,3. Lattice degeneration w/ atrophic holes, left eye - pigmented lattice at 1230 with retinal holes - s/p laser retinopexy OS (02.21.23) - good laser surrounding - continue to monitor  4. PVD / vitreous syneresis OU  - OD with stable release of VMA to full PVD -- symptomatic flashes and floaters improved  - OS - stable PVD  - Discussed findings and prognosis  - No RT or RD on 360 exam OU  - Reviewed s/s of RT/RD  - strict return precautions for any such RT/RD symptoms  5. Age related macular degeneration, non-exudative, both eyes  - The incidence, anatomy, and pathology of dry AMD, risk of progression, and the AREDS and AREDS 2 study including smoking risks discussed with patient.   - continue Amsler grid monitoring  6. Mixed Cataract OU - The symptoms of cataract, surgical options, and treatments and risks were discussed with patient. - discussed diagnosis and progression - BCVA decreased OU and exam shows progression of cataracts  - pt referred by Dr. Ellin Mayhew to Dr. Nancy Fetter for cat eval -- appt on July 02, 2022  Ophthalmic Meds Ordered this visit:  Meds ordered this encounter  Medications   Bevacizumab (AVASTIN) SOLN 1.25 mg     Return in about 8 weeks (around 08/17/2022) for f/u BRVO OD, DFE, OCT.  There are no Patient Instructions on file for this visit.   Explained the diagnoses, plan, and follow up with the patient and they expressed understanding.  Patient expressed understanding of the importance of proper follow up care.   This document serves as a record of services personally performed by Gardiner Sleeper, MD, PhD. It was created on their behalf by Renaldo Reel, [place] an ophthalmic technician. The creation of this record is the provider's dictation and/or activities during  the visit.    Electronically signed by:  Renaldo Reel, COT  03.21.24 3:55 PM  This document serves as a record of services personally performed by Gardiner Sleeper, MD, PhD. It was created on their behalf by San Jetty. Owens Shark, OA an ophthalmic technician. The creation of this record is the provider's dictation and/or activities during the visit.    Electronically signed by: San Jetty. Owens Shark, [place] 04.02.2024 3:55 PM  Gardiner Sleeper, M.D., Ph.D. Diseases & Surgery of the Retina and Vitreous [facility]  I have reviewed the above documentation for accuracy and completeness, and I agree with the above. Gardiner Sleeper, M.D., Ph.D. 06/22/22 3:58 PM   Abbreviations: M myopia (nearsighted); A astigmatism; H hyperopia (farsighted); P presbyopia; Mrx spectacle prescription;  CTL contact lenses; OD right eye; OS left eye; OU both eyes  XT exotropia; ET esotropia; PEK punctate epithelial keratitis; PEE punctate epithelial erosions; DES dry eye syndrome; MGD meibomian gland dysfunction; ATs artificial tears; PFAT's preservative free artificial tears; [place] nuclear sclerotic cataract; PSC posterior subcapsular cataract; ERM epi-retinal membrane; PVD posterior vitreous detachment; RD retinal detachment; DM diabetes mellitus; DR diabetic retinopathy; NPDR non-proliferative diabetic retinopathy; PDR proliferative diabetic retinopathy; CSME clinically significant macular edema; DME diabetic macular edema; dbh dot blot hemorrhages; CWS cotton wool spot; POAG primary open angle glaucoma; C/D cup-to-disc ratio; HVF humphrey visual field; GVF goldmann visual field; OCT optical coherence tomography; IOP intraocular pressure; BRVO Branch retinal vein occlusion; CRVO central retinal vein occlusion; CRAO central retinal artery occlusion; BRAO branch retinal artery occlusion; RT retinal tear; SB scleral buckle; PPV pars plana vitrectomy; VH Vitreous hemorrhage; PRP panretinal laser photocoagulation; IVK  intravitreal kenalog; VMT vitreomacular traction; MH Macular hole;  NVD neovascularization of the disc; NVE neovascularization elsewhere; AREDS age related eye disease study; ARMD age related macular degeneration; POAG primary open angle glaucoma; EBMD epithelial/anterior basement membrane dystrophy; ACIOL anterior chamber intraocular lens; IOL intraocular lens; PCIOL posterior chamber intraocular lens; Phaco/IOL phacoemulsification with intraocular lens placement; [place] photorefractive keratectomy; LASIK laser assisted in situ keratomileusis; HTN hypertension; DM diabetes mellitus; COPD chronic obstructive pulmonary disease

## 2022-06-22 ENCOUNTER — Ambulatory Visit (INDEPENDENT_AMBULATORY_CARE_PROVIDER_SITE_OTHER): Payer: BC Managed Care – PPO | Admitting: Ophthalmology

## 2022-06-22 ENCOUNTER — Encounter (INDEPENDENT_AMBULATORY_CARE_PROVIDER_SITE_OTHER): Payer: Self-pay | Admitting: Ophthalmology

## 2022-06-22 DIAGNOSIS — H25813 Combined forms of age-related cataract, bilateral: Secondary | ICD-10-CM

## 2022-06-22 DIAGNOSIS — H43813 Vitreous degeneration, bilateral: Secondary | ICD-10-CM | POA: Diagnosis not present

## 2022-06-22 DIAGNOSIS — H353132 Nonexudative age-related macular degeneration, bilateral, intermediate dry stage: Secondary | ICD-10-CM | POA: Diagnosis not present

## 2022-06-22 DIAGNOSIS — H34831 Tributary (branch) retinal vein occlusion, right eye, with macular edema: Secondary | ICD-10-CM | POA: Diagnosis not present

## 2022-06-22 DIAGNOSIS — H33302 Unspecified retinal break, left eye: Secondary | ICD-10-CM

## 2022-06-22 DIAGNOSIS — H35412 Lattice degeneration of retina, left eye: Secondary | ICD-10-CM

## 2022-06-22 MED ORDER — BEVACIZUMAB CHEMO INJECTION 1.25MG/0.05ML SYRINGE FOR KALEIDOSCOPE
1.2500 mg | INTRAVITREAL | Status: AC | PRN
  Administered 2022-06-22: 1.25 mg via INTRAVITREAL

## 2022-08-09 NOTE — Progress Notes (Shared)
[facility] Clinic Note  08/17/2022     CHIEF COMPLAINT Patient presents for Retina Follow Up   HISTORY OF PRESENT ILLNESS: Sara Glover is a 67 y.o. female who presents to the clinic today for:   HPI     Retina Follow Up   Patient presents with  CRVO/BRVO.  In right eye.  This started 8 weeks ago.  Duration of 8 weeks.  Since onset it is stable.        Comments   8 week retina follow up BRVO OD and IVA OD pt had cataract surgery OD May 12th having OS May 29th       Last edited by Etheleen Mayhew, COT on 08/17/2022  2:48 PM.       Referring physician: Isla Pence, OD [address] [place],  [place] [address]  HISTORICAL INFORMATION:   Selected notes from the MEDICAL RECORD NUMBER Referred by Dr. Clydene Pugh for concern of BRVO LEE:  Ocular Hx- PMH-    CURRENT MEDICATIONS: No current outpatient medications on file. (Ophthalmic Drugs)   No current facility-administered medications for this visit. (Ophthalmic Drugs)   No current outpatient medications on file. (Other)   No current facility-administered medications for this visit. (Other)   REVIEW OF SYSTEMS: ROS   Positive for: Cardiovascular, Eyes, Respiratory Negative for: Constitutional, Gastrointestinal, Neurological, Skin, Genitourinary, Musculoskeletal, HENT, Endocrine, Psychiatric, Allergic/Imm, Heme/Lymph Last edited by Etheleen Mayhew, COT on 08/17/2022  2:48 PM.      ALLERGIES Allergies  Allergen Reactions   Metformin     REACTION: diarrhea   PAST MEDICAL HISTORY Past Medical History:  Diagnosis Date   Diabetes mellitus 2004   type II   Hyperlipidemia 2004   Hypertension 2004   Morbid obesity (HCC)    Sleep apnea    Past Surgical History:  Procedure Laterality Date   bariactric surgery     CATARACT EXTRACTION     CESAREAN SECTION     CHOLECYSTECTOMY     FAMILY HISTORY Family History  Problem Relation Age of Onset   Diabetes Mother     Cancer Mother        colon CA   Schizophrenia Mother    Diabetes Father    Cancer Sister        ovarian CA in 58s   Diabetes Brother    SOCIAL HISTORY Social History   Tobacco Use   Smoking status: Never   Smokeless tobacco: Never  Vaping Use   Vaping Use: Never used  Substance Use Topics   Alcohol use: No   Drug use: No       OPHTHALMIC EXAM:  Base Eye Exam     Visual Acuity (Snellen - Linear)       Right Left   Dist [place] 20/30 20/30   Dist ph [place] 20/25 -2 NI         Tonometry (Tonopen, 2:50 PM)       Right Left   Pressure 18 17         Pupils       Pupils Dark Light Shape React APD   Right PERRL 3 2 Round Brisk None   Left PERRL 3 2 Round Brisk None         Visual Fields       Left Right    Full Full         Extraocular Movement       Right Left  Full, Ortho Full, Ortho         Neuro/Psych     Oriented x3: Yes   Mood/Affect: Normal         Dilation     Both eyes: 2.5% Phenylephrine @ 2:50 PM           Slit Lamp and Fundus Exam     Slit Lamp Exam       Right Left   Lids/Lashes Dermatochalasis - upper lid, mild MGD Dermatochalasis - upper lid, mild MGD   Conjunctiva/Sclera White and quiet Mild temporal pinguecula   Cornea trace PEE Mild tear film debris   Anterior Chamber Deep and quiet Deep and quiet   Iris Round and dilated Round and dilated   Lens 3+ Nuclear sclerosis with early brunescence, 2-3+ Cortical cataract 3+ Nuclear sclerosis with mild brunescence, 2-3+ Cortical cataract   Anterior Vitreous Mild Vitreous syneresis, Posterior vitreous detachment, Vitreous condensations Mild Vitreous syneresis, Posterior vitreous detachment, vitreous condensations         Fundus Exam       Right Left   Disc Pink and Sharp Pink and Sharp, PPA/PPP   C/D Ratio 0.2 0.3   Macula Blunted foveal reflex, cluster of flame hemes and DBH inferior macula -- improved, interval improvement in edema IN mac and fovea, residual CWS  inferior macula Flat, Blunted foveal reflex, RPE mottling, No heme or edema   Vessels attenuated, Tortuous, IT BRVO attenuated, mild tortuosity   Periphery Attached, focal cluster of drusen at 0200 equator, No heme, No RT/RD Attached, pigmented lattice at 0100 with retinal holes - good laser surrounding, no new RT/RD/Lattice           Refraction     Wearing Rx       Sphere Cylinder Axis Add   Right -1.75 +0.75 008 +1.75   Left -2.25 +0.50 137 +1.75           IMAGING AND PROCEDURES  Imaging and Procedures for 08/17/2022  OCT, Retina - OU - Both Eyes       Right Eye Quality was good. Central Foveal Thickness: 234. Progression has improved. Findings include normal foveal contour, no SRF, retinal drusen , intraretinal hyper-reflective material, intraretinal fluid (stable improvement in foveal contour and resolution of central IRF, mild improvement in IRF and edema IN macula).   Left Eye Quality was good. Central Foveal Thickness: 245. Progression has been stable. Findings include normal foveal contour, no IRF, no SRF, myopic contour, retinal drusen (Focal cluster of drusen ST macula).   Notes *Images captured and stored on drive  Diagnosis / Impression:  OD: inferior BRVO with CME -- stable improvement in foveal contour and resolution of central IRF, mild improvement in IRF and edema IN macula OS: non-exu ARMD   Clinical management:  See below  Abbreviations: NFP - Normal foveal profile. CME - cystoid macular edema. PED - pigment epithelial detachment. IRF - intraretinal fluid. SRF - subretinal fluid. EZ - ellipsoid zone. ERM - epiretinal membrane. ORA - outer retinal atrophy. ORT - outer retinal tubulation. SRHM - subretinal hyper-reflective material. IRHM - intraretinal hyper-reflective material      Intravitreal Injection, Pharmacologic Agent - OD - Right Eye       Time Out 08/17/2022. 3:11 PM. Confirmed correct patient, procedure, site, and patient consented.    Anesthesia Topical anesthesia was used. Anesthetic medications included Lidocaine 2%, Proparacaine 0.5%.   Procedure Preparation included 5% betadine to ocular surface, eyelid speculum. A supplied (32g) needle was used.  Injection: 1.25 mg Bevacizumab 1.25mg /0.54ml   Route: Intravitreal, Site: Right Eye   NDC: [id number]   Post-op Post injection exam found visual acuity of at least counting fingers. The patient tolerated the procedure well. There were no complications. The patient received written and verbal post procedure care education. Post injection medications were not given.             ASSESSMENT/PLAN:    ICD-10-CM   1. Branch retinal vein occlusion of right eye with macular edema  H34.8310 OCT, Retina - OU - Both Eyes    Intravitreal Injection, Pharmacologic Agent - OD - Right Eye    2. Posterior vitreous detachment of both eyes  H43.813     3. Left retinal defect  H33.302     4. Lattice degeneration of left retina  H35.412     5. Intermediate stage nonexudative age-related macular degeneration of both eyes  H35.3132     6. Combined forms of age-related cataract of both eyes  H25.813      1. BRVO w/ CME OD - s/p IVA OD #1 (02.14.23), #2 (03.14.23), #3 (04.11.23), #4 (05.26.23), #5 (06.23.23), #6 (07.28.23), #7 (09.11.23), #8 (11.14.23), #9 (12.29.23), #10 (02.12.24), #11 (04.02.24) - BCVA  20/25  - improved - OCT shows OD: inferior BRVO with CME -- stable improvement in foveal contour, mild interval increase in cystic changes/edema IN fovea and macula - recommend IVA OD #12 today, 05.28.24 w/ extension to 8 wks - pt wishes to proceed with IVA OD - RBA of procedure discussed, questions answered - informed consent obtained and signed - see procedure note  - F/U 6-7 weeks -- DFE/OCT/possible injection  2,3. Lattice degeneration w/ atrophic holes, left eye - pigmented lattice at 1230 with retinal holes - s/p laser retinopexy OS (02.21.23) - good laser  surrounding - monitor  4. PVD / vitreous syneresis OU - OD with stable release of VMA to full PVD -- symptomatic flashes and floaters improved  - OS - stable PVD  - Discussed findings and prognosis  - No RT or RD on 360 exam OU  - Reviewed s/s of RT/RD  - strict return precautions for any such RT/RD symptoms  - monitor  5. Age related macular degeneration, non-exudative, both eyes - The incidence, anatomy, and pathology of dry AMD, risk of progression, and the AREDS and AREDS 2 study including smoking risks discussed with patient.   - continue Amsler grid monitoring  - monitor  6. Mixed Cataract OU - The symptoms of cataract, surgical options, and treatments and risks were discussed with patient. - discussed diagnosis and progression - BCVA decreased OU and exam shows progression of cataracts  - IOL sx OD 5/18  - pt referred by Dr. Clydene Pugh to Dr. Wynelle Link for cat eval -- apt 5/30  Choroidal Nevus, OD  - pigmented lesion at 01:30, 1.55DD  - no visual symptoms, SRF or orange pigment  - +drusen  - thickness </> 2mm  - discussed findings, prognosis  - recommend monitoring       Ophthalmic Meds Ordered this visit:  No orders of the defined types were placed in this encounter.    Return in about 8 weeks (around 10/12/2022) for f/u BRVO OD , DFE, OCT, Possible, IVA, OD.  There are no Patient Instructions on file for this visit.   Explained the diagnoses, plan, and follow up with the patient and they expressed understanding.  Patient expressed understanding of the importance of proper follow up care.  This document serves as a record of services personally performed by Karie Chimera, MD, PhD. It was created on their behalf by Gerilyn Nestle, COT an ophthalmic technician. The creation of this record is the provider's dictation and/or activities during the visit.    Electronically signed by:  Gerilyn Nestle, COT  05.28.24 3:39 PM   Karie Chimera, M.D., Ph.D. Diseases  & Surgery of the Retina and Vitreous [facility]    Abbreviations: M myopia (nearsighted); A astigmatism; H hyperopia (farsighted); P presbyopia; Mrx spectacle prescription;  CTL contact lenses; OD right eye; OS left eye; OU both eyes  XT exotropia; ET esotropia; PEK punctate epithelial keratitis; PEE punctate epithelial erosions; DES dry eye syndrome; MGD meibomian gland dysfunction; ATs artificial tears; PFAT's preservative free artificial tears; [place] nuclear sclerotic cataract; PSC posterior subcapsular cataract; ERM epi-retinal membrane; PVD posterior vitreous detachment; RD retinal detachment; DM diabetes mellitus; DR diabetic retinopathy; NPDR non-proliferative diabetic retinopathy; PDR proliferative diabetic retinopathy; CSME clinically significant macular edema; DME diabetic macular edema; dbh dot blot hemorrhages; CWS cotton wool spot; POAG primary open angle glaucoma; C/D cup-to-disc ratio; HVF humphrey visual field; GVF goldmann visual field; OCT optical coherence tomography; IOP intraocular pressure; BRVO Branch retinal vein occlusion; CRVO central retinal vein occlusion; CRAO central retinal artery occlusion; BRAO branch retinal artery occlusion; RT retinal tear; SB scleral buckle; PPV pars plana vitrectomy; VH Vitreous hemorrhage; PRP panretinal laser photocoagulation; IVK intravitreal kenalog; VMT vitreomacular traction; MH Macular hole;  NVD neovascularization of the disc; NVE neovascularization elsewhere; AREDS age related eye disease study; ARMD age related macular degeneration; POAG primary open angle glaucoma; EBMD epithelial/anterior basement membrane dystrophy; ACIOL anterior chamber intraocular lens; IOL intraocular lens; PCIOL posterior chamber intraocular lens; Phaco/IOL phacoemulsification with intraocular lens placement; PRK photorefractive keratectomy; LASIK laser assisted in situ keratomileusis; HTN hypertension; DM diabetes mellitus; COPD chronic obstructive  pulmonary disease

## 2022-08-17 ENCOUNTER — Ambulatory Visit (INDEPENDENT_AMBULATORY_CARE_PROVIDER_SITE_OTHER): Payer: BC Managed Care – PPO | Admitting: Ophthalmology

## 2022-08-17 ENCOUNTER — Encounter (INDEPENDENT_AMBULATORY_CARE_PROVIDER_SITE_OTHER): Payer: Self-pay | Admitting: Ophthalmology

## 2022-08-17 DIAGNOSIS — H34831 Tributary (branch) retinal vein occlusion, right eye, with macular edema: Secondary | ICD-10-CM | POA: Diagnosis not present

## 2022-08-17 DIAGNOSIS — H25813 Combined forms of age-related cataract, bilateral: Secondary | ICD-10-CM

## 2022-08-17 DIAGNOSIS — Z961 Presence of intraocular lens: Secondary | ICD-10-CM

## 2022-08-17 DIAGNOSIS — H35412 Lattice degeneration of retina, left eye: Secondary | ICD-10-CM

## 2022-08-17 DIAGNOSIS — H33302 Unspecified retinal break, left eye: Secondary | ICD-10-CM | POA: Diagnosis not present

## 2022-08-17 DIAGNOSIS — H43813 Vitreous degeneration, bilateral: Secondary | ICD-10-CM | POA: Diagnosis not present

## 2022-08-17 DIAGNOSIS — H353132 Nonexudative age-related macular degeneration, bilateral, intermediate dry stage: Secondary | ICD-10-CM | POA: Diagnosis not present

## 2022-08-17 DIAGNOSIS — H25812 Combined forms of age-related cataract, left eye: Secondary | ICD-10-CM

## 2022-08-17 DIAGNOSIS — D229 Melanocytic nevi, unspecified: Secondary | ICD-10-CM

## 2022-08-17 MED ORDER — BEVACIZUMAB CHEMO INJECTION 1.25MG/0.05ML SYRINGE FOR KALEIDOSCOPE
1.2500 mg | INTRAVITREAL | Status: AC | PRN
  Administered 2022-08-17: 1.25 mg via INTRAVITREAL

## 2022-09-21 NOTE — Progress Notes (Signed)
[facility] - Clinic Note  09/27/2022     CHIEF COMPLAINT Patient presents for Retina Follow Up   HISTORY OF PRESENT ILLNESS: Sara Glover is a 67 y.o. female who presents to the clinic today for:   HPI     Retina Follow Up   Patient presents with  CRVO/BRVO.  In both eyes.  This started 6 weeks ago.  Duration of 6 weeks.  Since onset it is stable.  I, the attending physician,  performed the HPI with the patient and updated documentation appropriately.        Comments   6 week retina follow up BRVO OD and IVA OD pt is reporting no vision changes noticed she denies any flashes or floaters pt had cataract surgery May 29th       Last edited by Rennis Chris, MD on 09/27/2022  4:24 PM.    Patient states has had CE with IOL OU. Distance vision good OU, but near vision not good.   Referring physician: Isla Pence, OD [address] [place],  [place] [address]  HISTORICAL INFORMATION:   Selected notes from the MEDICAL RECORD NUMBER Referred by Dr. Clydene Pugh for concern of BRVO LEE:  Ocular Hx- PMH-    CURRENT MEDICATIONS: No current outpatient medications on file. (Ophthalmic Drugs)   No current facility-administered medications for this visit. (Ophthalmic Drugs)   No current outpatient medications on file. (Other)   No current facility-administered medications for this visit. (Other)   REVIEW OF SYSTEMS: ROS   Positive for: Cardiovascular, Eyes, Respiratory Negative for: Constitutional, Gastrointestinal, Neurological, Skin, Genitourinary, Musculoskeletal, HENT, Endocrine, Psychiatric, Allergic/Imm, Heme/Lymph Last edited by Etheleen Mayhew, COT on 09/27/2022  2:52 PM.     ALLERGIES Allergies  Allergen Reactions   Metformin     REACTION: diarrhea   PAST MEDICAL HISTORY Past Medical History:  Diagnosis Date   Diabetes mellitus 2004   type II   Hyperlipidemia 2004   Hypertension 2004   Morbid obesity (HCC)    Sleep apnea     Past Surgical History:  Procedure Laterality Date   bariactric surgery     CATARACT EXTRACTION     CESAREAN SECTION     CHOLECYSTECTOMY     FAMILY HISTORY Family History  Problem Relation Age of Onset   Diabetes Mother    Cancer Mother        colon CA   Schizophrenia Mother    Diabetes Father    Cancer Sister        ovarian CA in 3s   Diabetes Brother    SOCIAL HISTORY Social History   Tobacco Use   Smoking status: Never   Smokeless tobacco: Never  Vaping Use   Vaping Use: Never used  Substance Use Topics   Alcohol use: No   Drug use: No       OPHTHALMIC EXAM:  Base Eye Exam     Visual Acuity (Snellen - Linear)       Right Left   Dist [place] 20/30 20/25- -2   Dist ph [place] 20/25 -1 20/20 -3         Tonometry (Tonopen, 2:58 PM)       Right Left   Pressure 15 14         Pupils       Pupils Dark Light Shape React APD   Right PERRL 3 2 Round Brisk None   Left PERRL 3 2 Round Brisk None  Visual Fields       Left Right    Full Full         Extraocular Movement       Right Left    Full, Ortho Full, Ortho         Neuro/Psych     Oriented x3: Yes   Mood/Affect: Normal         Dilation     Both eyes: 2.5% Phenylephrine @ 2:57 PM           Slit Lamp and Fundus Exam     Slit Lamp Exam       Right Left   Lids/Lashes Dermatochalasis - upper lid, mild MGD Dermatochalasis - upper lid, mild MGD   Conjunctiva/Sclera White and quiet Mild temporal pinguecula   Cornea trace PEE, Arcus, Well healed  cataract wound, Debris in tear film Mild tear film debris, well healed cataract wound   Anterior Chamber Deep and clear Deep and quiet   Iris Round and dilated Round and dilated   Lens PC IOL in good postition PC IOL good position   Anterior Vitreous Mild Vitreous syneresis, Posterior vitreous detachment, Vitreous condensations Mild Vitreous syneresis, Posterior vitreous detachment, vitreous condensations         Fundus Exam        Right Left   Disc Pink and Sharp, mild pallor Pink and Sharp, PPA/PPP, mild pallor   C/D Ratio 0.2 0.3   Macula Blunted foveal reflex, cluster of flame hemes and DBH inferior macula -- improved, mild interval improvement in edema/cystic changes IN mac and fovea, residual CWS inferior macula--fading Flat, Blunted foveal reflex, RPE mottling, No heme or edema   Vessels attenuated, Tortuous, IT BRVO, AV crossing changes attenuated, mild tortuosity   Periphery Attached, pigmented choroidal lesion with overlying drusen at 130 relatively flat 1.5DD, No heme, No RT/RD Attached, pigmented lattice at 0100 with retinal holes - good laser surrounding, no new RT/RD/Lattice           Refraction     Wearing Rx       Sphere Cylinder Axis Add   Right -1.75 +0.75 008 +1.75   Left -2.25 +0.50 137 +1.75           IMAGING AND PROCEDURES  Imaging and Procedures for 09/27/2022  OCT, Retina - OU - Both Eyes       Right Eye Quality was good. Central Foveal Thickness: 234. Progression has been stable. Findings include normal foveal contour, no SRF, retinal drusen , intraretinal hyper-reflective material, intraretinal fluid (stable improvement in foveal contour, persistent cystic changes/edema IN fovea and macula).   Left Eye Quality was good. Central Foveal Thickness: 251. Progression has been stable. Findings include normal foveal contour, no IRF, no SRF, myopic contour, retinal drusen (Focal cluster of drusen ST macula).   Notes *Images captured and stored on drive  Diagnosis / Impression:  OD: inferior BRVO with CME -- stable improvement in foveal contour, persistent cystic changes/edema IN fovea and macula OS: non-exu ARMD   Clinical management:  See below  Abbreviations: NFP - Normal foveal profile. CME - cystoid macular edema. PED - pigment epithelial detachment. IRF - intraretinal fluid. SRF - subretinal fluid. EZ - ellipsoid zone. ERM - epiretinal membrane. ORA - outer retinal  atrophy. ORT - outer retinal tubulation. SRHM - subretinal hyper-reflective material. IRHM - intraretinal hyper-reflective material      Intravitreal Injection, Pharmacologic Agent - OD - Right Eye       Time Out  09/27/2022. 3:50 PM. Confirmed correct patient, procedure, site, and patient consented.   Anesthesia Topical anesthesia was used. Anesthetic medications included Lidocaine 2%, Proparacaine 0.5%.   Procedure Preparation included 5% betadine to ocular surface, eyelid speculum. A (32g) needle was used.   Injection: 1.25 mg Bevacizumab 1.25mg /0.66ml   Route: Intravitreal, Site: Right Eye   NDC: [id number], Lot: [id number], Expiration date: 07/13/2023   Post-op Post injection exam found visual acuity of at least counting fingers. The patient tolerated the procedure well. There were no complications. The patient received written and verbal post procedure care education. Post injection medications were not given.            ASSESSMENT/PLAN:    ICD-10-CM   1. Branch retinal vein occlusion of right eye with macular edema  H34.8310 OCT, Retina - OU - Both Eyes    Intravitreal Injection, Pharmacologic Agent - OD - Right Eye    Bevacizumab (AVASTIN) SOLN 1.25 mg    2. Posterior vitreous detachment of both eyes  H43.813     3. Left retinal defect  H33.302     4. Lattice degeneration of left retina  H35.412     5. Intermediate stage nonexudative age-related macular degeneration of both eyes  H35.3132     6. 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 BRVO w/ CME OD - s/p IVA OD #1 (02.14.23), #2 (03.14.23), #3 (04.11.23), #4 (05.26.23), #5 (06.23.23), #6 (07.28.23), #7 (09.11.23), #8 (11.14.23), #9 (12.29.23), #10 (02.12.24), #11 (04.02.24), #12 (05.28.24) - BCVA OD 20/25  - stable - OCT shows OD: inferior BRVO with CME -- stable improvement in foveal contour, persistent cystic changes/edema IN fovea and macula at 6 wks - recommend  IVA OD #13 today, 07.08.24 w/ f/u in 6 wks - pt wishes to proceed with IVA OD - RBA of procedure discussed, questions answered - Avastin informed consent obtained and signed OD 07.08.24 - see procedure note  - F/U 6 weeks -- DFE/OCT/possible injection  2,3. Lattice degeneration w/ atrophic holes, left eye - pigmented lattice at 1230 with retinal holes - s/p laser retinopexy OS (02.21.23) - good laser surrounding - monitor  4. PVD / vitreous syneresis OU - OD with stable release of VMA to full PVD -- symptomatic flashes and floaters improved  - OS - stable PVD  - Discussed findings and prognosis  - No RT or RD on 360 exam OU  - Reviewed s/s of RT/RD  - strict return precautions for any such RT/RD symptoms  - monitor  5. Age related macular degeneration, non-exudative, both eyes - The incidence, anatomy, and pathology of dry AMD, risk of progression, and the AREDS and AREDS 2 study including smoking risks discussed with patient.   - continue Amsler grid monitoring  - monitor  6. Pseudophakia OU  - s/p CE/IOL OD (04.18.24, Dr. Wynelle Link)  - IOL in good position, doing well  - monitor  7. Choroidal Nevus, OD  - pigmented lesion at 01:30, 1.5DD  - no visual symptoms, SRF or orange pigment  - +drusen  - thickness < 2mm  - discussed findings, prognosis  - recommend monitoring  Ophthalmic Meds Ordered this visit:  Meds ordered this encounter  Medications   Bevacizumab (AVASTIN) SOLN 1.25 mg     Return in about 6 weeks (around 11/08/2022) for f/u BRVO OD , DFE, OCT, Possible, IVA, OD.  There are no Patient Instructions on  file for this visit.   Explained the diagnoses, plan, and follow up with the patient and they expressed understanding.  Patient expressed understanding of the importance of proper follow up care.   This document serves as a record of services personally performed by Karie Chimera, MD, PhD. It was created on their behalf by De Blanch, an ophthalmic  technician. The creation of this record is the provider's dictation and/or activities during the visit.    Electronically signed by: De Blanch, OA, 09/27/22  4:29 PM  This document serves as a record of services personally performed by Karie Chimera, MD, PhD. It was created on their behalf by Annalee Genta, COMT. The creation of this record is the provider's dictation and/or activities during the visit.  Electronically signed by: Annalee Genta, COMT 09/27/22 4:29 PM  Karie Chimera, M.D., Ph.D. Diseases & Surgery of the Retina and Vitreous [facility]  I have reviewed the above documentation for accuracy and completeness, and I agree with the above. Karie Chimera, M.D., Ph.D. 09/27/22 4:30 PM   Abbreviations: M myopia (nearsighted); A astigmatism; H hyperopia (farsighted); P presbyopia; Mrx spectacle prescription;  CTL contact lenses; OD right eye; OS left eye; OU both eyes  XT exotropia; ET esotropia; PEK punctate epithelial keratitis; PEE punctate epithelial erosions; DES dry eye syndrome; MGD meibomian gland dysfunction; ATs artificial tears; PFAT's preservative free artificial tears; NSC nuclear sclerotic cataract; PSC posterior subcapsular cataract; ERM epi-retinal membrane; PVD posterior vitreous detachment; RD retinal detachment; DM diabetes mellitus; DR diabetic retinopathy; NPDR non-proliferative diabetic retinopathy; PDR proliferative diabetic retinopathy; CSME clinically significant macular edema; DME diabetic macular edema; dbh dot blot hemorrhages; CWS cotton wool spot; POAG primary open angle glaucoma; C/D cup-to-disc ratio; HVF humphrey visual field; GVF goldmann visual field; OCT optical coherence tomography; IOP intraocular pressure; BRVO Branch retinal vein occlusion; CRVO central retinal vein occlusion; CRAO central retinal artery occlusion; BRAO branch retinal artery occlusion; RT retinal tear; SB scleral buckle; PPV pars plana vitrectomy; VH  Vitreous hemorrhage; PRP panretinal laser photocoagulation; IVK intravitreal kenalog; VMT vitreomacular traction; MH Macular hole;  NVD neovascularization of the disc; NVE neovascularization elsewhere; AREDS age related eye disease study; ARMD age related macular degeneration; POAG primary open angle glaucoma; EBMD epithelial/anterior basement membrane dystrophy; ACIOL anterior chamber intraocular lens; IOL intraocular lens; PCIOL posterior chamber intraocular lens; Phaco/IOL phacoemulsification with intraocular lens placement; PRK photorefractive keratectomy; LASIK laser assisted in situ keratomileusis; HTN hypertension; DM diabetes mellitus; COPD chronic obstructive pulmonary disease

## 2022-09-27 ENCOUNTER — Encounter (INDEPENDENT_AMBULATORY_CARE_PROVIDER_SITE_OTHER): Payer: Self-pay | Admitting: Ophthalmology

## 2022-09-27 ENCOUNTER — Ambulatory Visit (INDEPENDENT_AMBULATORY_CARE_PROVIDER_SITE_OTHER): Payer: BC Managed Care – PPO | Admitting: Ophthalmology

## 2022-09-27 DIAGNOSIS — H33302 Unspecified retinal break, left eye: Secondary | ICD-10-CM | POA: Diagnosis not present

## 2022-09-27 DIAGNOSIS — H43813 Vitreous degeneration, bilateral: Secondary | ICD-10-CM

## 2022-09-27 DIAGNOSIS — D229 Melanocytic nevi, unspecified: Secondary | ICD-10-CM

## 2022-09-27 DIAGNOSIS — H353132 Nonexudative age-related macular degeneration, bilateral, intermediate dry stage: Secondary | ICD-10-CM | POA: Diagnosis not present

## 2022-09-27 DIAGNOSIS — H35412 Lattice degeneration of retina, left eye: Secondary | ICD-10-CM

## 2022-09-27 DIAGNOSIS — D3131 Benign neoplasm of right choroid: Secondary | ICD-10-CM | POA: Diagnosis not present

## 2022-09-27 DIAGNOSIS — Z961 Presence of intraocular lens: Secondary | ICD-10-CM

## 2022-09-27 DIAGNOSIS — H25812 Combined forms of age-related cataract, left eye: Secondary | ICD-10-CM

## 2022-09-27 DIAGNOSIS — H34831 Tributary (branch) retinal vein occlusion, right eye, with macular edema: Secondary | ICD-10-CM

## 2022-09-27 MED ORDER — BEVACIZUMAB CHEMO INJECTION 1.25MG/0.05ML SYRINGE FOR KALEIDOSCOPE
1.25 mg | INTRAVITREAL | Status: AC | PRN
Start: 2022-09-27 — End: 2022-09-27
  Administered 2022-09-27: 1.25 mg via INTRAVITREAL

## 2022-11-02 NOTE — Progress Notes (Signed)
[facility] - Clinic Note  11/08/2022     CHIEF COMPLAINT Patient presents for Retina Follow Up   HISTORY OF PRESENT ILLNESS: Sara Glover is a 67 y.o. female who presents to the clinic today for:   HPI     Retina Follow Up   Patient presents with  Retinal Break/Detachment.  In right eye.  This started 6 weeks ago.  Duration of 6 weeks.  Since onset it is stable.  I, the attending physician,  performed the HPI with the patient and updated documentation appropriately.        Comments   6 week retina follow up and BRVO OD and IVA OD pt is reporting no vision changes noticed she denies flashes or floaters       Last edited by Rennis Chris, MD on 11/08/2022  4:52 PM.    Patient states the vision is doing good.   Referring physician: Isla Pence, OD [address] [place],  [place] [address]  HISTORICAL INFORMATION:   Selected notes from the MEDICAL RECORD NUMBER Referred by Dr. Clydene Pugh for concern of BRVO LEE:  Ocular Hx- PMH-    CURRENT MEDICATIONS: No current outpatient medications on file. (Ophthalmic Drugs)   No current facility-administered medications for this visit. (Ophthalmic Drugs)   No current outpatient medications on file. (Other)   No current facility-administered medications for this visit. (Other)   REVIEW OF SYSTEMS: ROS   Positive for: Cardiovascular, Eyes, Respiratory Negative for: Constitutional, Gastrointestinal, Neurological, Skin, Genitourinary, Musculoskeletal, HENT, Endocrine, Psychiatric, Allergic/Imm, Heme/Lymph Last edited by Etheleen Mayhew, COT on 11/08/2022  2:58 PM.     ALLERGIES Allergies  Allergen Reactions   Metformin     REACTION: diarrhea   PAST MEDICAL HISTORY Past Medical History:  Diagnosis Date   Diabetes mellitus 2004   type II   Hyperlipidemia 2004   Hypertension 2004   Morbid obesity (HCC)    Sleep apnea    Past Surgical History:  Procedure Laterality Date   bariactric  surgery     CATARACT EXTRACTION     CESAREAN SECTION     CHOLECYSTECTOMY     FAMILY HISTORY Family History  Problem Relation Age of Onset   Diabetes Mother    Cancer Mother        colon CA   Schizophrenia Mother    Diabetes Father    Cancer Sister        ovarian CA in 41s   Diabetes Brother    SOCIAL HISTORY Social History   Tobacco Use   Smoking status: Never   Smokeless tobacco: Never  Vaping Use   Vaping status: Never Used  Substance Use Topics   Alcohol use: No   Drug use: No       OPHTHALMIC EXAM:  Base Eye Exam     Visual Acuity (Snellen - Linear)       Right Left   Dist [place] 20/30 20/30   Dist ph [place] NI NI         Tonometry (Tonopen, 3:01 PM)       Right Left   Pressure 14 16         Pupils       Pupils Dark Light Shape React APD   Right PERRL 3 2 Round Brisk None   Left PERRL 3 2 Round Brisk None         Visual Fields       Left Right  Full Full         Extraocular Movement       Right Left    Full, Ortho Full, Ortho         Neuro/Psych     Oriented x3: Yes   Mood/Affect: Normal         Dilation     Both eyes: 2.5% Phenylephrine @ 3:01 PM           Slit Lamp and Fundus Exam     Slit Lamp Exam       Right Left   Lids/Lashes Dermatochalasis - upper lid, mild MGD Dermatochalasis - upper lid, mild MGD   Conjunctiva/Sclera White and quiet Mild temporal pinguecula   Cornea trace PEE, Arcus, Well healed  cataract wound, Debris in tear film Mild tear film debris, well healed cataract wound   Anterior Chamber Deep and clear Deep and quiet   Iris Round and dilated Round and dilated   Lens PC IOL in good postition PC IOL good position   Anterior Vitreous Mild Vitreous syneresis, Posterior vitreous detachment, Vitreous condensations Mild Vitreous syneresis, Posterior vitreous detachment, vitreous condensations         Fundus Exam       Right Left   Disc Pink and Sharp, mild pallor Pink and Sharp, PPA/PPP, mild  pallor   C/D Ratio 0.2 0.3   Macula Blunted foveal reflex, cluster of flame hemes and DBH inferior macula -- improved, persistent edema/cystic changes IN mac and fovea, residual CWS inferior macula--fading Flat, good foveal reflex, RPE mottling, No heme or edema   Vessels attenuated, Tortuous, IT BRVO, AV crossing changes attenuated, mild tortuosity   Periphery Attached, pigmented choroidal lesion with overlying drusen at 130 relatively flat 1.5DD, No heme, No RT/RD Attached, pigmented lattice at 0100 with retinal holes - good laser surrounding, no new RT/RD/Lattice           Refraction     Wearing Rx       Sphere Cylinder Axis Add   Right -1.75 +0.75 008 +1.75   Left -2.25 +0.50 137 +1.75           IMAGING AND PROCEDURES  Imaging and Procedures for 11/08/2022  OCT, Retina - OU - Both Eyes       Right Eye Quality was good. Central Foveal Thickness: 235. Progression has been stable. Findings include normal foveal contour, no SRF, retinal drusen , intraretinal hyper-reflective material, intraretinal fluid (stable improvement in foveal contour, persistent cystic changes/edema IN fovea and macula--minimal improvement ).   Left Eye Quality was good. Central Foveal Thickness: 253. Progression has been stable. Findings include normal foveal contour, no IRF, no SRF, myopic contour, retinal drusen (Focal cluster of drusen ST macula).   Notes *Images captured and stored on drive  Diagnosis / Impression:  OD: inferior BRVO with CME -- stable improvement in foveal contour, persistent cystic changes/edema IN fovea and macula--minimal improvement OS: non-exu ARMD   Clinical management:  See below  Abbreviations: NFP - Normal foveal profile. CME - cystoid macular edema. PED - pigment epithelial detachment. IRF - intraretinal fluid. SRF - subretinal fluid. EZ - ellipsoid zone. ERM - epiretinal membrane. ORA - outer retinal atrophy. ORT - outer retinal tubulation. SRHM - subretinal  hyper-reflective material. IRHM - intraretinal hyper-reflective material      Intravitreal Injection, Pharmacologic Agent - OD - Right Eye       Time Out 11/08/2022. 3:28 PM. Confirmed correct patient, procedure, site, and patient consented.  Anesthesia Topical anesthesia was used. Anesthetic medications included Lidocaine 2%, Proparacaine 0.5%.   Procedure Preparation included 5% betadine to ocular surface, eyelid speculum. A supplied (32g) needle was used.   Injection: 1.25 mg Bevacizumab 1.25mg /0.70ml   Route: Intravitreal, Site: Right Eye   NDC: 13086-578-46, Lot: [id number] , Expiration date: 12/04/2022   Post-op Post injection exam found visual acuity of at least counting fingers. The patient tolerated the procedure well. There were no complications. The patient received written and verbal post procedure care education. Post injection medications were not given.            ASSESSMENT/PLAN:    ICD-10-CM   1. Branch retinal vein occlusion of right eye with macular edema  H34.8310 OCT, Retina - OU - Both Eyes    Intravitreal Injection, Pharmacologic Agent - OD - Right Eye    Bevacizumab (AVASTIN) SOLN 1.25 mg    2. Posterior vitreous detachment of both eyes  H43.813     3. Left retinal defect  H33.302     4. Lattice degeneration of left retina  H35.412     5. Intermediate stage nonexudative age-related macular degeneration of both eyes  H35.3132     6. 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 BRVO w/ CME OD - s/p IVA OD #1 (02.14.23), #2 (03.14.23), #3 (04.11.23), #4 (05.26.23), #5 (06.23.23), #6 (07.28.23), #7 (09.11.23), #8 (11.14.23), #9 (12.29.23), #10 (02.12.24), #11 (04.02.24), #12 (05.28.24), #13 (07.08.24) - BCVA OD 20/30  - OCT shows OD: inferior BRVO with CME -- stable improvement in foveal contour, persistent cystic changes/edema IN fovea and macula--minimal improvement at 6 wks **discussed decreased  efficacy / resistance to Avastin and potential benefit of switching medication**   - will get Regino Bellow for next visit - recommend IVA OD #14 today, 08.19.24 w/ f/u dec back to 5 wks - pt wishes to proceed with IVA OD - RBA of procedure discussed, questions answered - Avastin informed consent obtained and signed OD 07.08.24 - see procedure note  - F/U 5 weeks -- DFE/OCT/possible injection  2,3. Lattice degeneration w/ atrophic holes, left eye - pigmented lattice at 1230 with retinal holes - s/p laser retinopexy OS (02.21.23) - good laser surrounding - monitor  4. PVD / vitreous syneresis OU - OD with stable release of VMA to full PVD -- symptomatic flashes and floaters improved  - OS - stable PVD  - Discussed findings and prognosis  - No RT or RD on 360 exam OU  - Reviewed s/s of RT/RD  - strict return precautions for any such RT/RD symptoms  - monitor  5. Age related macular degeneration, non-exudative, both eyes - The incidence, anatomy, and pathology of dry AMD, risk of progression, and the AREDS and AREDS 2 study including smoking risks discussed with patient.   - continue Amsler grid monitoring  - monitor  6. Pseudophakia OU  - s/p CE/IOL OD (04.18.24, Dr. Wynelle Link)  - IOL in good position, doing well  - monitor  7. Choroidal Nevus, OD  - pigmented lesion at 01:30, 1.5DD  - no visual symptoms, SRF or orange pigment  - +drusen  - thickness < 2mm  - discussed findings, prognosis  - recommend monitoring  Ophthalmic Meds Ordered this visit:  Meds ordered this encounter  Medications   Bevacizumab (AVASTIN) SOLN 1.25 mg     Return in about 5 weeks (around 12/13/2022) for f/u BRVO  OD , DFE, OCT, Possible, IVA vs. IVE, OD.  There are no Patient Instructions on file for this visit.   Explained the diagnoses, plan, and follow up with the patient and they expressed understanding.  Patient expressed understanding of the importance of proper follow up care.   This document  serves as a record of services personally performed by Karie Chimera, MD, PhD. It was created on their behalf by De Blanch, an ophthalmic technician. The creation of this record is the provider's dictation and/or activities during the visit.    Electronically signed by: De Blanch, OA, 11/08/22  4:54 PM  This document serves as a record of services personally performed by Karie Chimera, MD, PhD. It was created on their behalf by Laurey Morale, COT an ophthalmic technician. The creation of this record is the provider's dictation and/or activities during the visit.    Electronically signed by:  Charlette Caffey, COT  11/08/22 4:54 PM  Karie Chimera, M.D., Ph.D. Diseases & Surgery of the Retina and Vitreous [facility]  I have reviewed the above documentation for accuracy and completeness, and I agree with the above. Karie Chimera, M.D., Ph.D. 11/08/22 4:55 PM   Abbreviations: M myopia (nearsighted); A astigmatism; H hyperopia (farsighted); P presbyopia; Mrx spectacle prescription;  CTL contact lenses; OD right eye; OS left eye; OU both eyes  XT exotropia; ET esotropia; PEK punctate epithelial keratitis; PEE punctate epithelial erosions; DES dry eye syndrome; MGD meibomian gland dysfunction; ATs artificial tears; PFAT's preservative free artificial tears; NSC nuclear sclerotic cataract; PSC posterior subcapsular cataract; ERM epi-retinal membrane; PVD posterior vitreous detachment; RD retinal detachment; DM diabetes mellitus; DR diabetic retinopathy; NPDR non-proliferative diabetic retinopathy; PDR proliferative diabetic retinopathy; CSME clinically significant macular edema; DME diabetic macular edema; dbh dot blot hemorrhages; CWS cotton wool spot; POAG primary open angle glaucoma; C/D cup-to-disc ratio; HVF humphrey visual field; GVF goldmann visual field; OCT optical coherence tomography; IOP intraocular pressure; BRVO Branch retinal vein occlusion;  CRVO central retinal vein occlusion; CRAO central retinal artery occlusion; BRAO branch retinal artery occlusion; RT retinal tear; SB scleral buckle; PPV pars plana vitrectomy; VH Vitreous hemorrhage; PRP panretinal laser photocoagulation; IVK intravitreal kenalog; VMT vitreomacular traction; MH Macular hole;  NVD neovascularization of the disc; NVE neovascularization elsewhere; AREDS age related eye disease study; ARMD age related macular degeneration; POAG primary open angle glaucoma; EBMD epithelial/anterior basement membrane dystrophy; ACIOL anterior chamber intraocular lens; IOL intraocular lens; PCIOL posterior chamber intraocular lens; Phaco/IOL phacoemulsification with intraocular lens placement; PRK photorefractive keratectomy; LASIK laser assisted in situ keratomileusis; HTN hypertension; DM diabetes mellitus; COPD chronic obstructive pulmonary disease

## 2022-11-08 ENCOUNTER — Encounter (INDEPENDENT_AMBULATORY_CARE_PROVIDER_SITE_OTHER): Payer: Self-pay | Admitting: Ophthalmology

## 2022-11-08 ENCOUNTER — Ambulatory Visit (INDEPENDENT_AMBULATORY_CARE_PROVIDER_SITE_OTHER): Payer: BC Managed Care – PPO | Admitting: Ophthalmology

## 2022-11-08 DIAGNOSIS — Z961 Presence of intraocular lens: Secondary | ICD-10-CM

## 2022-11-08 DIAGNOSIS — H43813 Vitreous degeneration, bilateral: Secondary | ICD-10-CM | POA: Diagnosis not present

## 2022-11-08 DIAGNOSIS — D3131 Benign neoplasm of right choroid: Secondary | ICD-10-CM | POA: Diagnosis not present

## 2022-11-08 DIAGNOSIS — H353132 Nonexudative age-related macular degeneration, bilateral, intermediate dry stage: Secondary | ICD-10-CM | POA: Diagnosis not present

## 2022-11-08 DIAGNOSIS — H35412 Lattice degeneration of retina, left eye: Secondary | ICD-10-CM

## 2022-11-08 DIAGNOSIS — H33302 Unspecified retinal break, left eye: Secondary | ICD-10-CM

## 2022-11-08 DIAGNOSIS — H34831 Tributary (branch) retinal vein occlusion, right eye, with macular edema: Secondary | ICD-10-CM

## 2022-11-08 MED ORDER — BEVACIZUMAB CHEMO INJECTION 1.25MG/0.05ML SYRINGE FOR KALEIDOSCOPE
1.2500 mg | INTRAVITREAL | Status: AC | PRN
Start: 2022-11-08 — End: 2022-11-08
  Administered 2022-11-08: 1.25 mg via INTRAVITREAL

## 2022-12-17 NOTE — Progress Notes (Signed)
[facility] - Clinic Note  12/20/2022     CHIEF COMPLAINT Patient presents for Retina Follow Up   HISTORY OF PRESENT ILLNESS: Sara Glover is a 67 y.o. female who presents to the clinic today for:   HPI     Retina Follow Up   Patient presents with  CRVO/BRVO.  In right eye.  This started 5 weeks ago.  Duration of 5 weeks.  Since onset it is stable.  I, the attending physician,  performed the HPI with the patient and updated documentation appropriately.        Comments   5 week retina follow up BRVO OD IVE OD pt is reporting no vision changes noticed any flashes or floaters       Last edited by Rennis Chris, MD on 12/20/2022  4:35 PM.    Patient feels like she is getting used to her new glasses  Referring physician: Isla Pence, OD [address] [place],  [place] [address]  HISTORICAL INFORMATION:   Selected notes from the MEDICAL RECORD NUMBER Referred by Dr. Clydene Pugh for concern of BRVO LEE:  Ocular Hx- PMH-    CURRENT MEDICATIONS: No current outpatient medications on file. (Ophthalmic Drugs)   No current facility-administered medications for this visit. (Ophthalmic Drugs)   Current Outpatient Medications (Other)  Medication Sig   lisinopril-hydrochlorothiazide (ZESTORETIC) 10-12.5 MG tablet Take 0.5 tablets by mouth daily.   No current facility-administered medications for this visit. (Other)   REVIEW OF SYSTEMS: ROS   Positive for: Cardiovascular, Eyes, Respiratory Negative for: Constitutional, Gastrointestinal, Neurological, Skin, Genitourinary, Musculoskeletal, HENT, Endocrine, Psychiatric, Allergic/Imm, Heme/Lymph Last edited by Etheleen Mayhew, COT on 12/20/2022  2:57 PM.      ALLERGIES Allergies  Allergen Reactions   Metformin     REACTION: diarrhea   PAST MEDICAL HISTORY Past Medical History:  Diagnosis Date   Diabetes mellitus 2004   type II   Hyperlipidemia 2004   Hypertension 2004   Morbid obesity  (HCC)    Sleep apnea    Past Surgical History:  Procedure Laterality Date   bariactric surgery     CATARACT EXTRACTION     CESAREAN SECTION     CHOLECYSTECTOMY     FAMILY HISTORY Family History  Problem Relation Age of Onset   Diabetes Mother    Cancer Mother        colon CA   Schizophrenia Mother    Diabetes Father    Cancer Sister        ovarian CA in 75s   Diabetes Brother    SOCIAL HISTORY Social History   Tobacco Use   Smoking status: Never   Smokeless tobacco: Never  Vaping Use   Vaping status: Never Used  Substance Use Topics   Alcohol use: No   Drug use: No       OPHTHALMIC EXAM:  Base Eye Exam     Visual Acuity (Snellen - Linear)       Right Left   Dist cc 20/20 20/25 -1   Dist ph cc  NI         Tonometry (Tonopen, 3:04 PM)       Right Left   Pressure 16 16         Pupils       Pupils Dark Light Shape React APD   Right PERRL 3 2 Round Brisk None   Left PERRL 3 2 Round Brisk None  Visual Fields       Left Right    Full Full         Extraocular Movement       Right Left    Full, Ortho Full, Ortho         Neuro/Psych     Oriented x3: Yes   Mood/Affect: Normal         Dilation     Both eyes: 2.5% Phenylephrine @ 3:04 PM           Slit Lamp and Fundus Exam     Slit Lamp Exam       Right Left   Lids/Lashes Dermatochalasis - upper lid, mild MGD Dermatochalasis - upper lid, mild MGD   Conjunctiva/Sclera White and quiet Mild temporal pinguecula   Cornea trace PEE, Arcus, Well healed  cataract wound, Debris in tear film Mild tear film debris, well healed cataract wound   Anterior Chamber Deep and clear Deep and quiet   Iris Round and dilated Round and dilated   Lens PC IOL in good postition PC IOL good position   Anterior Vitreous Mild Vitreous syneresis, Posterior vitreous detachment, Vitreous condensations Mild Vitreous syneresis, Posterior vitreous detachment, vitreous condensations          Fundus Exam       Right Left   Disc mild pallor, Sharp rim mild Pallor, Sharp rim, PPA/PPP   C/D Ratio 0.2 0.3   Macula Blunted foveal reflex, cluster of flame hemes and DBH inferior macula -- improved, persistent edema/cystic changes IN mac and fovea -- improved, residual CWS inferior macula -- resolved Flat, good foveal reflex, RPE mottling, No heme or edema   Vessels attenuated, Tortuous, IT BRVO, AV crossing changes attenuated, mild tortuosity   Periphery Attached, pigmented choroidal lesion with overlying drusen at 130 relatively flat 1.5DD, No heme, No RT/RD Attached, pigmented lattice at 0100 with retinal holes - good laser surrounding, no new RT/RD/Lattice           Refraction     Wearing Rx       Sphere Cylinder Axis Add   Right -1.75 +0.75 008 +1.75   Left -2.25 +0.50 137 +1.75           IMAGING AND PROCEDURES  Imaging and Procedures for 12/20/2022  OCT, Retina - OU - Both Eyes       Right Eye Quality was good. Central Foveal Thickness: 234. Progression has improved. Findings include normal foveal contour, no SRF, retinal drusen , intraretinal hyper-reflective material, intraretinal fluid (stable improvement in foveal contour, interval improvement in cystic changes/edema IN fovea and macula).   Left Eye Quality was good. Central Foveal Thickness: 252. Progression has been stable. Findings include normal foveal contour, no IRF, no SRF, myopic contour, retinal drusen (Focal cluster of drusen ST macula).   Notes *Images captured and stored on drive  Diagnosis / Impression:  OD: inferior BRVO with CME -- stable improvement in foveal contour, interval improvement in cystic changes/edema IN fovea and macula OS: non-exu ARMD   Clinical management:  See below  Abbreviations: NFP - Normal foveal profile. CME - cystoid macular edema. PED - pigment epithelial detachment. IRF - intraretinal fluid. SRF - subretinal fluid. EZ - ellipsoid zone. ERM - epiretinal membrane.  ORA - outer retinal atrophy. ORT - outer retinal tubulation. SRHM - subretinal hyper-reflective material. IRHM - intraretinal hyper-reflective material      Intravitreal Injection, Pharmacologic Agent - OD - Right Eye  Time Out 12/20/2022. 4:09 PM. Confirmed correct patient, procedure, site, and patient consented.   Anesthesia Topical anesthesia was used. Anesthetic medications included Lidocaine 2%, Proparacaine 0.5%.   Procedure Preparation included 5% betadine to ocular surface, eyelid speculum. A (32g) needle was used.   Injection: 2 mg aflibercept 2 MG/0.05ML   Route: Intravitreal, Site: Right Eye   NDC: L6038910, Lot: [id number], Expiration date: 02/19/2024, Waste: 0 mL   Post-op Post injection exam found visual acuity of at least counting fingers. The patient tolerated the procedure well. There were no complications. The patient received written and verbal post procedure care education. Post injection medications were not given.            ASSESSMENT/PLAN:    ICD-10-CM   1. Branch retinal vein occlusion of right eye with macular edema  H34.8310 OCT, Retina - OU - Both Eyes    Intravitreal Injection, Pharmacologic Agent - OD - Right Eye    aflibercept (EYLEA) SOLN 2 mg    2. Lattice degeneration of left retina  H35.412     3. Retinal hole of left eye  H33.322     4. Posterior vitreous detachment of both eyes  H43.813     5. Intermediate stage nonexudative age-related macular degeneration of both eyes  H35.3132     6. 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 BRVO w/ CME OD - s/p IVA OD #1 (02.14.23), #2 (03.14.23), #3 (04.11.23), #4 (05.26.23), #5 (06.23.23), #6 (07.28.23), #7 (09.11.23), #8 (11.14.23), #9 (12.29.23), #10 (02.12.24), #11 (04.02.24), #12 (05.28.24), #13 (07.08.24), #14 (08.19.24) - BCVA OD improved to 20/20 from 20/30  - OCT shows OD: inferior BRVO with CME -- stable improvement in foveal  contour, interval improvement in cystic changes/edema IN fovea and macula at 6 wks **discussed decreased efficacy / resistance to Avastin and potential benefit of switching medication**  - recommend switching to IVE OD #1 today, 09.30.24 w/ f/u decreased back to 5 wks - pt wishes to proceed with IVA OD - RBA of procedure discussed, questions answered - Avastin informed consent obtained and signed OD 07.08.24 - see procedure note  - Eylea approved for 2024 - F/U 5 weeks -- DFE/OCT/possible injection  2,3. Lattice degeneration w/ atrophic holes, left eye - pigmented lattice at 1230 with retinal holes - s/p laser retinopexy OS (02.21.23) - good laser surrounding - monitor  4. PVD / vitreous syneresis OU - OD with stable release of VMA to full PVD -- symptomatic flashes and floaters improved  - OS - stable PVD  - Discussed findings and prognosis  - No RT or RD on 360 exam OU  - Reviewed s/s of RT/RD  - strict return precautions for any such RT/RD symptoms  - monitor  5. Age related macular degeneration, non-exudative, both eyes - The incidence, anatomy, and pathology of dry AMD, risk of progression, and the AREDS and AREDS 2 study including smoking risks discussed with patient.   - continue Amsler grid monitoring  - monitor  6. Pseudophakia OU  - s/p CE/IOL OD (04.18.24, Dr. Wynelle Link)  - IOL in good position, doing well  - monitor  7. Choroidal Nevus, OD  - pigmented lesion at 01:30, 1.5DD  - no visual symptoms, SRF or orange pigment  - +drusen  - thickness < 2mm  - discussed findings, prognosis  - recommend monitoring  Ophthalmic Meds Ordered this visit:  Meds ordered  this encounter  Medications   aflibercept (EYLEA) SOLN 2 mg     Return in about 5 weeks (around 01/24/2023) for f/u BRVO OD, DFE, OCT.  There are no Patient Instructions on file for this visit.   Explained the diagnoses, plan, and follow up with the patient and they expressed understanding.  Patient expressed  understanding of the importance of proper follow up care.   This document serves as a record of services personally performed by Karie Chimera, MD, PhD. It was created on their behalf by Annalee Genta, COMT. The creation of this record is the provider's dictation and/or activities during the visit.  Electronically signed by: Annalee Genta, COMT 12/20/22 4:52 PM  This document serves as a record of services personally performed by Karie Chimera, MD, PhD. It was created on their behalf by Glee Arvin. Manson Passey, OA an ophthalmic technician. The creation of this record is the provider's dictation and/or activities during the visit.    Electronically signed by: Glee Arvin. Manson Passey, OA 12/20/22 4:52 PM   Karie Chimera, M.D., Ph.D. Diseases & Surgery of the Retina and Vitreous [facility]  I have reviewed the above documentation for accuracy and completeness, and I agree with the above. Karie Chimera, M.D., Ph.D. 12/20/22 5:05 PM  Abbreviations: M myopia (nearsighted); A astigmatism; H hyperopia (farsighted); P presbyopia; Mrx spectacle prescription;  CTL contact lenses; OD right eye; OS left eye; OU both eyes  XT exotropia; ET esotropia; PEK punctate epithelial keratitis; PEE punctate epithelial erosions; DES dry eye syndrome; MGD meibomian gland dysfunction; ATs artificial tears; PFAT's preservative free artificial tears; NSC nuclear sclerotic cataract; PSC posterior subcapsular cataract; ERM epi-retinal membrane; PVD posterior vitreous detachment; RD retinal detachment; DM diabetes mellitus; DR diabetic retinopathy; NPDR non-proliferative diabetic retinopathy; PDR proliferative diabetic retinopathy; CSME clinically significant macular edema; DME diabetic macular edema; dbh dot blot hemorrhages; CWS cotton wool spot; POAG primary open angle glaucoma; C/D cup-to-disc ratio; HVF humphrey visual field; GVF goldmann visual field; OCT optical coherence tomography; IOP intraocular pressure;  BRVO Branch retinal vein occlusion; CRVO central retinal vein occlusion; CRAO central retinal artery occlusion; BRAO branch retinal artery occlusion; RT retinal tear; SB scleral buckle; PPV pars plana vitrectomy; VH Vitreous hemorrhage; PRP panretinal laser photocoagulation; IVK intravitreal kenalog; VMT vitreomacular traction; MH Macular hole;  NVD neovascularization of the disc; NVE neovascularization elsewhere; AREDS age related eye disease study; ARMD age related macular degeneration; POAG primary open angle glaucoma; EBMD epithelial/anterior basement membrane dystrophy; ACIOL anterior chamber intraocular lens; IOL intraocular lens; PCIOL posterior chamber intraocular lens; Phaco/IOL phacoemulsification with intraocular lens placement; PRK photorefractive keratectomy; LASIK laser assisted in situ keratomileusis; HTN hypertension; DM diabetes mellitus; COPD chronic obstructive pulmonary disease

## 2022-12-20 ENCOUNTER — Encounter (INDEPENDENT_AMBULATORY_CARE_PROVIDER_SITE_OTHER): Payer: Self-pay | Admitting: Ophthalmology

## 2022-12-20 ENCOUNTER — Ambulatory Visit (INDEPENDENT_AMBULATORY_CARE_PROVIDER_SITE_OTHER): Payer: BC Managed Care – PPO | Admitting: Ophthalmology

## 2022-12-20 DIAGNOSIS — H353132 Nonexudative age-related macular degeneration, bilateral, intermediate dry stage: Secondary | ICD-10-CM | POA: Diagnosis not present

## 2022-12-20 DIAGNOSIS — H33322 Round hole, left eye: Secondary | ICD-10-CM

## 2022-12-20 DIAGNOSIS — D3131 Benign neoplasm of right choroid: Secondary | ICD-10-CM

## 2022-12-20 DIAGNOSIS — H43813 Vitreous degeneration, bilateral: Secondary | ICD-10-CM | POA: Diagnosis not present

## 2022-12-20 DIAGNOSIS — H34831 Tributary (branch) retinal vein occlusion, right eye, with macular edema: Secondary | ICD-10-CM

## 2022-12-20 DIAGNOSIS — Z961 Presence of intraocular lens: Secondary | ICD-10-CM

## 2022-12-20 DIAGNOSIS — H35412 Lattice degeneration of retina, left eye: Secondary | ICD-10-CM

## 2022-12-20 MED ORDER — AFLIBERCEPT 2MG/0.05ML IZ SOLN FOR KALEIDOSCOPE
2.0000 mg | INTRAVITREAL | Status: AC | PRN
Start: 2022-12-20 — End: 2022-12-20
  Administered 2022-12-20: 2 mg via INTRAVITREAL

## 2023-01-24 NOTE — Progress Notes (Signed)
[facility] - Clinic Note  01/31/2023     CHIEF COMPLAINT Patient presents for Retina Follow Up   HISTORY OF PRESENT ILLNESS: Sara Glover is a 67 y.o. female who presents to the clinic today for:   HPI     Retina Follow Up   Patient presents with  CRVO/BRVO.  In right eye.  This started 6 weeks ago.  Duration of 6 weeks.  Since onset it is stable.  I, the attending physician,  performed the HPI with the patient and updated documentation appropriately.        Comments   6 week retina follow up and I'VE OD pt is reporting vision is about the same she denies any flashes or floaters       Last edited by Rennis Chris, MD on 01/31/2023  4:53 PM.     Patient feels her vision is not as good as it should be with her new glasses  Referring physician: Isla Pence, OD [address] [place],  [place] [address]  HISTORICAL INFORMATION:   Selected notes from the MEDICAL RECORD NUMBER Referred by Dr. Clydene Pugh for concern of BRVO LEE:  Ocular Hx- PMH-    CURRENT MEDICATIONS: No current outpatient medications on file. (Ophthalmic Drugs)   No current facility-administered medications for this visit. (Ophthalmic Drugs)   Current Outpatient Medications (Other)  Medication Sig   lisinopril-hydrochlorothiazide (ZESTORETIC) 10-12.5 MG tablet Take 0.5 tablets by mouth daily.   No current facility-administered medications for this visit. (Other)   REVIEW OF SYSTEMS: ROS   Positive for: Cardiovascular, Eyes, Respiratory Negative for: Constitutional, Gastrointestinal, Neurological, Skin, Genitourinary, Musculoskeletal, HENT, Endocrine, Psychiatric, Allergic/Imm, Heme/Lymph Last edited by Etheleen Mayhew, COT on 01/31/2023  2:58 PM.     ALLERGIES Allergies  Allergen Reactions   Metformin     REACTION: diarrhea   PAST MEDICAL HISTORY Past Medical History:  Diagnosis Date   Diabetes mellitus 2004   type II   Hyperlipidemia 2004    Hypertension 2004   Morbid obesity (HCC)    Sleep apnea    Past Surgical History:  Procedure Laterality Date   bariactric surgery     CATARACT EXTRACTION     CESAREAN SECTION     CHOLECYSTECTOMY     FAMILY HISTORY Family History  Problem Relation Age of Onset   Diabetes Mother    Cancer Mother        colon CA   Schizophrenia Mother    Diabetes Father    Cancer Sister        ovarian CA in 58s   Diabetes Brother    SOCIAL HISTORY Social History   Tobacco Use   Smoking status: Never   Smokeless tobacco: Never  Vaping Use   Vaping status: Never Used  Substance Use Topics   Alcohol use: No   Drug use: No       OPHTHALMIC EXAM:  Base Eye Exam     Visual Acuity (Snellen - Linear)       Right Left   Dist cc 20/25 20/20 -2   Dist ph cc NI NI    Correction: Glasses         Tonometry (Tonopen, 3:10 PM)       Right Left   Pressure 14 12         Pupils       Pupils Dark Light Shape React APD   Right PERRL 3 2 Round Brisk None   Left  PERRL 3 2 Round Brisk None         Visual Fields       Left Right    Full Full         Extraocular Movement       Right Left    Full, Ortho Full, Ortho         Neuro/Psych     Oriented x3: Yes   Mood/Affect: Normal         Dilation     Both eyes: 2.5% Phenylephrine @ 3:10 PM           Slit Lamp and Fundus Exam     Slit Lamp Exam       Right Left   Lids/Lashes Dermatochalasis - upper lid, mild MGD Dermatochalasis - upper lid, mild MGD   Conjunctiva/Sclera White and quiet Mild temporal pinguecula   Cornea trace PEE, Arcus, Well healed  cataract wound, Debris in tear film Mild tear film debris, well healed cataract wound   Anterior Chamber Deep and clear Deep and quiet   Iris Round and dilated Round and dilated   Lens PC IOL in good postition PC IOL good position   Anterior Vitreous Mild Vitreous syneresis, Posterior vitreous detachment, Vitreous condensations Mild Vitreous syneresis,  Posterior vitreous detachment, vitreous condensations         Fundus Exam       Right Left   Disc mild pallor, Sharp rim mild Pallor, Sharp rim, PPA/PPP   C/D Ratio 0.2 0.3   Macula Blunted foveal reflex, persistent edema/cystic changes IN mac and fovea -- resolved, no heme Flat, good foveal reflex, RPE mottling, No heme or edema   Vessels attenuated, Tortuous, IT BRVO attenuated, mild tortuosity   Periphery Attached, pigmented choroidal lesion with overlying drusen at 130, relatively flat, 1.5DD, No heme, No RT/RD Attached, pigmented lattice at 0100 with retinal holes - good laser surrounding, no new RT/RD/Lattice           Refraction     Wearing Rx       Sphere Cylinder Axis Add   Right -1.75 +0.75 008 +1.75   Left -2.25 +0.50 137 +1.75           IMAGING AND PROCEDURES  Imaging and Procedures for 01/31/2023  OCT, Retina - OU - Both Eyes       Right Eye Quality was good. Central Foveal Thickness: 234. Progression has improved. Findings include normal foveal contour, no IRF, no SRF, retinal drusen , intraretinal hyper-reflective material (interval improvement / resolution of cystic changes/edema IN fovea and macula, stable improvement in foveal contour).   Left Eye Quality was good. Central Foveal Thickness: 256. Progression has been stable. Findings include normal foveal contour, no IRF, no SRF, myopic contour, retinal drusen (Focal cluster of drusen ST macula).   Notes *Images captured and stored on drive  Diagnosis / Impression:  OD: inferior BRVO with CME -- interval improvement / resolution of cystic changes/edema IN fovea and macula, stable improvement in foveal contour OS: non-exu ARMD   Clinical management:  See below  Abbreviations: NFP - Normal foveal profile. CME - cystoid macular edema. PED - pigment epithelial detachment. IRF - intraretinal fluid. SRF - subretinal fluid. EZ - ellipsoid zone. ERM - epiretinal membrane. ORA - outer retinal atrophy. ORT  - outer retinal tubulation. SRHM - subretinal hyper-reflective material. IRHM - intraretinal hyper-reflective material      Intravitreal Injection, Pharmacologic Agent - OD - Right Eye  Time Out 01/31/2023. 3:51 PM. Confirmed correct patient, procedure, site, and patient consented.   Anesthesia Topical anesthesia was used. Anesthetic medications included Lidocaine 2%, Proparacaine 0.5%.   Procedure Preparation included 5% betadine to ocular surface, eyelid speculum. A (32g) needle was used.   Injection: 2 mg aflibercept 2 MG/0.05ML   Route: Intravitreal, Site: Right Eye   NDC: L6038910, Lot: [id number], Expiration date: 03/21/2024, Waste: 0 mL   Post-op Post injection exam found visual acuity of at least counting fingers. The patient tolerated the procedure well. There were no complications. The patient received written and verbal post procedure care education. Post injection medications were not given.            ASSESSMENT/PLAN:    ICD-10-CM   1. Branch retinal vein occlusion of right eye with macular edema  H34.8310 OCT, Retina - OU - Both Eyes    Intravitreal Injection, Pharmacologic Agent - OD - Right Eye    aflibercept (EYLEA) SOLN 2 mg    2. Lattice degeneration of left retina  H35.412     3. Retinal hole of left eye  H33.322     4. Posterior vitreous detachment of both eyes  H43.813     5. Intermediate stage nonexudative age-related macular degeneration of both eyes  H35.3132     6. 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 BRVO w/ CME OD - s/p IVA OD #1 (02.14.23), #2 (03.14.23), #3 (04.11.23), #4 (05.26.23), #5 (06.23.23), #6 (07.28.23), #7 (09.11.23), #8 (11.14.23), #9 (12.29.23), #10 (02.12.24), #11 (04.02.24), #12 (05.28.24), #13 (07.08.24), #14 (08.19.24) -- IVA resistance - s/p IVE OD #1 (09.30.24) - BCVA OD 20/25 from 20/20 - OCT shows OD: inferior BRVO with CME -- interval improvement /  resolution of cystic changes/edema IN fovea and macula, stable improvement in foveal contour at 6 wks - recommend IVE OD #2 today, 11.11.24 w/ f/u in 6 wks - pt wishes to proceed with injection - RBA of procedure discussed, questions answered - Avastin informed consent obtained and signed OD 07.08.24 - see procedure note  - Eylea approved for 2024 - F/U 6 weeks -- DFE/OCT/possible injection  2,3. Lattice degeneration w/ atrophic holes, left eye - pigmented lattice at 1230 with retinal holes - s/p laser retinopexy OS (02.21.23) - good laser surrounding - monitor  4. PVD / vitreous syneresis OU - OD with stable release of VMA to full PVD -- symptomatic flashes and floaters improved  - OS - stable PVD  - Discussed findings and prognosis  - No RT or RD on 360 exam OU  - Reviewed s/s of RT/RD  - strict return precautions for any such RT/RD symptoms  - monitor  5. Age related macular degeneration, non-exudative, both eyes - The incidence, anatomy, and pathology of dry AMD, risk of progression, and the AREDS and AREDS 2 study including smoking risks discussed with patient.   - continue Amsler grid monitoring  - monitor  6. Pseudophakia OU  - s/p CE/IOL OD (04.18.24, Dr. Wynelle Link)  - IOL in good position, doing well  - monitor  7. Choroidal Nevus, OD  - pigmented lesion at 01:30, 1.5DD  - no visual symptoms, SRF or orange pigment  - +drusen  - thickness < 2mm  - discussed findings, prognosis  - recommend monitoring  Ophthalmic Meds Ordered this visit:  Meds ordered this encounter  Medications   aflibercept (EYLEA) SOLN 2 mg  Return in about 5 weeks (around 03/07/2023) for BRVO w/ CME OD, Dilated Exam, OCT, Possible Injxn.  There are no Patient Instructions on file for this visit.   Explained the diagnoses, plan, and follow up with the patient and they expressed understanding.  Patient expressed understanding of the importance of proper follow up care.   This document  serves as a record of services personally performed by Karie Chimera, MD, PhD. It was created on their behalf by Glee Arvin. Manson Passey, OA an ophthalmic technician. The creation of this record is the provider's dictation and/or activities during the visit.    Electronically signed by: Glee Arvin. Manson Passey, OA 01/31/23 4:55 PM  Karie Chimera, M.D., Ph.D. Diseases & Surgery of the Retina and Vitreous [facility]  I have reviewed the above documentation for accuracy and completeness, and I agree with the above. Karie Chimera, M.D., Ph.D. 01/31/23 4:55 PM  Abbreviations: M myopia (nearsighted); A astigmatism; H hyperopia (farsighted); P presbyopia; Mrx spectacle prescription;  CTL contact lenses; OD right eye; OS left eye; OU both eyes  XT exotropia; ET esotropia; PEK punctate epithelial keratitis; PEE punctate epithelial erosions; DES dry eye syndrome; MGD meibomian gland dysfunction; ATs artificial tears; PFAT's preservative free artificial tears; NSC nuclear sclerotic cataract; PSC posterior subcapsular cataract; ERM epi-retinal membrane; PVD posterior vitreous detachment; RD retinal detachment; DM diabetes mellitus; DR diabetic retinopathy; NPDR non-proliferative diabetic retinopathy; PDR proliferative diabetic retinopathy; CSME clinically significant macular edema; DME diabetic macular edema; dbh dot blot hemorrhages; CWS cotton wool spot; POAG primary open angle glaucoma; C/D cup-to-disc ratio; HVF humphrey visual field; GVF goldmann visual field; OCT optical coherence tomography; IOP intraocular pressure; BRVO Branch retinal vein occlusion; CRVO central retinal vein occlusion; CRAO central retinal artery occlusion; BRAO branch retinal artery occlusion; RT retinal tear; SB scleral buckle; PPV pars plana vitrectomy; VH Vitreous hemorrhage; PRP panretinal laser photocoagulation; IVK intravitreal kenalog; VMT vitreomacular traction; MH Macular hole;  NVD neovascularization of the disc;  NVE neovascularization elsewhere; AREDS age related eye disease study; ARMD age related macular degeneration; POAG primary open angle glaucoma; EBMD epithelial/anterior basement membrane dystrophy; ACIOL anterior chamber intraocular lens; IOL intraocular lens; PCIOL posterior chamber intraocular lens; Phaco/IOL phacoemulsification with intraocular lens placement; PRK photorefractive keratectomy; LASIK laser assisted in situ keratomileusis; HTN hypertension; DM diabetes mellitus; COPD chronic obstructive pulmonary disease

## 2023-01-31 ENCOUNTER — Encounter (INDEPENDENT_AMBULATORY_CARE_PROVIDER_SITE_OTHER): Payer: Self-pay | Admitting: Ophthalmology

## 2023-01-31 ENCOUNTER — Ambulatory Visit (INDEPENDENT_AMBULATORY_CARE_PROVIDER_SITE_OTHER): Payer: BC Managed Care – PPO | Admitting: Ophthalmology

## 2023-01-31 DIAGNOSIS — H353132 Nonexudative age-related macular degeneration, bilateral, intermediate dry stage: Secondary | ICD-10-CM | POA: Diagnosis not present

## 2023-01-31 DIAGNOSIS — D3131 Benign neoplasm of right choroid: Secondary | ICD-10-CM

## 2023-01-31 DIAGNOSIS — H43813 Vitreous degeneration, bilateral: Secondary | ICD-10-CM

## 2023-01-31 DIAGNOSIS — H33322 Round hole, left eye: Secondary | ICD-10-CM | POA: Diagnosis not present

## 2023-01-31 DIAGNOSIS — Z961 Presence of intraocular lens: Secondary | ICD-10-CM

## 2023-01-31 DIAGNOSIS — H34831 Tributary (branch) retinal vein occlusion, right eye, with macular edema: Secondary | ICD-10-CM

## 2023-01-31 DIAGNOSIS — H35412 Lattice degeneration of retina, left eye: Secondary | ICD-10-CM

## 2023-01-31 MED ORDER — AFLIBERCEPT 2MG/0.05ML IZ SOLN FOR KALEIDOSCOPE
2.0000 mg | INTRAVITREAL | Status: AC | PRN
  Administered 2023-01-31: 2 mg via INTRAVITREAL

## 2023-02-22 NOTE — Progress Notes (Signed)
[facility] - Clinic Note  03/07/2023     CHIEF COMPLAINT Patient presents for Retina Follow Up   HISTORY OF PRESENT ILLNESS: Sara Glover is a 67 y.o. female who presents to the clinic today for:   HPI     Retina Follow Up   Patient presents with  CRVO/BRVO.  In both eyes.  This started 5 weeks ago.  Duration of 5 weeks.  Since onset it is stable.  I, the attending physician,  performed the HPI with the patient and updated documentation appropriately.        Comments   5 week retina follow up BRVO OD and IVE OD pt is reporting no vision changes noticed she denies any flashes or floaters       Last edited by Rennis Chris, MD on 03/07/2023  4:51 PM.      Patient states vision is good  Referring physician: Isla Pence, OD [address] [place],  [place] [address]  HISTORICAL INFORMATION:   Selected notes from the MEDICAL RECORD NUMBER Referred by Dr. Clydene Pugh for concern of BRVO LEE:  Ocular Hx- PMH-    CURRENT MEDICATIONS: No current outpatient medications on file. (Ophthalmic Drugs)   No current facility-administered medications for this visit. (Ophthalmic Drugs)   Current Outpatient Medications (Other)  Medication Sig   lisinopril-hydrochlorothiazide (ZESTORETIC) 10-12.5 MG tablet Take 0.5 tablets by mouth daily.   No current facility-administered medications for this visit. (Other)   REVIEW OF SYSTEMS: ROS   Positive for: Cardiovascular, Eyes, Respiratory Negative for: Constitutional, Gastrointestinal, Neurological, Skin, Genitourinary, Musculoskeletal, HENT, Endocrine, Psychiatric, Allergic/Imm, Heme/Lymph Last edited by Etheleen Mayhew, COT on 03/07/2023  2:51 PM.      ALLERGIES Allergies  Allergen Reactions   Metformin     REACTION: diarrhea   PAST MEDICAL HISTORY Past Medical History:  Diagnosis Date   Diabetes mellitus 2004   type II   Hyperlipidemia 2004   Hypertension 2004   Morbid obesity (HCC)     Sleep apnea    Past Surgical History:  Procedure Laterality Date   bariactric surgery     CATARACT EXTRACTION     CESAREAN SECTION     CHOLECYSTECTOMY     FAMILY HISTORY Family History  Problem Relation Age of Onset   Diabetes Mother    Cancer Mother        colon CA   Schizophrenia Mother    Diabetes Father    Cancer Sister        ovarian CA in 22s   Diabetes Brother    SOCIAL HISTORY Social History   Tobacco Use   Smoking status: Never   Smokeless tobacco: Never  Vaping Use   Vaping status: Never Used  Substance Use Topics   Alcohol use: No   Drug use: No       OPHTHALMIC EXAM:  Base Eye Exam     Visual Acuity (Snellen - Linear)       Right Left   Dist cc 20/25 -1 20/25   Dist ph cc NI NI         Tonometry (Tonopen, 2:56 PM)       Right Left   Pressure 13 11         Pupils       Pupils Dark Light Shape React APD   Right PERRL 3 2 Round Brisk None   Left PERRL 3 2 Round Brisk None  Visual Fields       Left Right    Full Full         Extraocular Movement       Right Left    Full, Ortho Full, Ortho         Neuro/Psych     Oriented x3: Yes   Mood/Affect: Normal         Dilation     Both eyes: 2.5% Phenylephrine @ 2:56 PM           Slit Lamp and Fundus Exam     Slit Lamp Exam       Right Left   Lids/Lashes Dermatochalasis - upper lid, mild MGD Dermatochalasis - upper lid, mild MGD   Conjunctiva/Sclera White and quiet Mild temporal pinguecula   Cornea trace PEE, Arcus, Well healed  cataract wound, Debris in tear film Mild tear film debris, well healed cataract wound   Anterior Chamber Deep and clear Deep and quiet   Iris Round and dilated Round and dilated   Lens PC IOL in good postition PC IOL good position   Anterior Vitreous Mild Vitreous syneresis, Posterior vitreous detachment, Vitreous condensations Mild Vitreous syneresis, Posterior vitreous detachment, vitreous condensations         Fundus  Exam       Right Left   Disc mild pallor, Sharp rim mild Pallor, Sharp rim, PPA/PPP   C/D Ratio 0.2 0.3   Macula Blunted foveal reflex, stable improvement in edema/cystic changes IN mac and fovea, no heme Flat, good foveal reflex, RPE mottling, No heme or edema   Vessels attenuated, Tortuous, IT BRVO attenuated, mild tortuosity   Periphery Attached, pigmented choroidal lesion with overlying drusen at 130, relatively flat, 1.5DD, No heme, No RT/RD Attached, pigmented lattice at 0100 with retinal holes - good laser surrounding, no new RT/RD/Lattice           Refraction     Wearing Rx       Sphere Cylinder Axis Add   Right -1.75 +0.75 008 +1.75   Left -2.25 +0.50 137 +1.75           IMAGING AND PROCEDURES  Imaging and Procedures for 03/07/2023  OCT, Retina - OU - Both Eyes       Right Eye Quality was good. Central Foveal Thickness: 234. Progression has been stable. Findings include normal foveal contour, no IRF, no SRF, retinal drusen , intraretinal hyper-reflective material (stable improvement / resolution of cystic changes/edema IN fovea and macula, stable improvement in foveal contour).   Left Eye Quality was good. Central Foveal Thickness: 256. Progression has been stable. Findings include normal foveal contour, no IRF, no SRF, myopic contour, retinal drusen (Focal cluster of drusen ST macula).   Notes *Images captured and stored on drive  Diagnosis / Impression:  OD: inferior BRVO with CME -- stable improvement / resolution of cystic changes/edema IN fovea and macula, stable improvement in foveal contour OS: non-exu ARMD   Clinical management:  See below  Abbreviations: NFP - Normal foveal profile. CME - cystoid macular edema. PED - pigment epithelial detachment. IRF - intraretinal fluid. SRF - subretinal fluid. EZ - ellipsoid zone. ERM - epiretinal membrane. ORA - outer retinal atrophy. ORT - outer retinal tubulation. SRHM - subretinal hyper-reflective material.  IRHM - intraretinal hyper-reflective material      Intravitreal Injection, Pharmacologic Agent - OD - Right Eye       Time Out 03/07/2023. 3:28 PM. Confirmed correct patient, procedure, site, and patient  consented.   Anesthesia Topical anesthesia was used. Anesthetic medications included Lidocaine 2%, Proparacaine 0.5%.   Procedure Preparation included 5% betadine to ocular surface, eyelid speculum. A (32g) needle was used.   Injection: 2 mg aflibercept 2 MG/0.05ML   Route: Intravitreal, Site: Right Eye   NDC: L6038910, Lot: [id number], Expiration date: 07/18/2024, Waste: 0 mL   Post-op Post injection exam found visual acuity of at least counting fingers. The patient tolerated the procedure well. There were no complications. The patient received written and verbal post procedure care education. Post injection medications were not given.            ASSESSMENT/PLAN:    ICD-10-CM   1. Branch retinal vein occlusion of right eye with macular edema  H34.8310 OCT, Retina - OU - Both Eyes    Intravitreal Injection, Pharmacologic Agent - OD - Right Eye    aflibercept (EYLEA) SOLN 2 mg    2. Lattice degeneration of left retina  H35.412     3. Retinal hole of left eye  H33.322     4. Posterior vitreous detachment of both eyes  H43.813     5. Intermediate stage nonexudative age-related macular degeneration of both eyes  H35.3132     6. 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 BRVO w/ CME OD - s/p IVA OD #1 (02.14.23), #2 (03.14.23), #3 (04.11.23), #4 (05.26.23), #5 (06.23.23), #6 (07.28.23), #7 (09.11.23), #8 (11.14.23), #9 (12.29.23), #10 (02.12.24), #11 (04.02.24), #12 (05.28.24), #13 (07.08.24), #14 (08.19.24) -- IVA resistance - s/p IVE OD #1 (09.30.24), #2 (11.11.24) - BCVA OD 20/25 - stable - OCT shows OD: inferior BRVO with CME -- interval improvement / resolution of cystic changes/edema IN fovea and macula, stable  improvement in foveal contour at 5 wks - recommend IVE OD #3 today, 12.16.24 w/ f/u extended to 6 wks - pt wishes to proceed with injection - RBA of procedure discussed, questions answered - Avastin informed consent obtained and signed OD 07.08.24 - see procedure note  - Eylea approved for 2024 - F/U 6 weeks -- DFE/OCT/possible injection  2,3. Lattice degeneration w/ atrophic holes, left eye - pigmented lattice at 1230 with retinal holes - s/p laser retinopexy OS (02.21.23) - good laser surrounding - monitor  4. PVD / vitreous syneresis OU - OD with stable release of VMA to full PVD -- symptomatic flashes and floaters improved  - OS - stable PVD  - Discussed findings and prognosis  - No RT or RD on 360 exam OU  - Reviewed s/s of RT/RD  - strict return precautions for any such RT/RD symptoms  - monitor  5. Age related macular degeneration, non-exudative, both eyes - The incidence, anatomy, and pathology of dry AMD, risk of progression, and the AREDS and AREDS 2 study including smoking risks discussed with patient.   - continue Amsler grid monitoring  - monitor  6. Pseudophakia OU  - s/p CE/IOL OD (04.18.24, Dr. Wynelle Link)  - IOL in good position, doing well  - monitor  7. Choroidal Nevus, OD -- stable  - pigmented lesion at 01:30, 1.5DD  - no visual symptoms, SRF or orange pigment  - +drusen  - thickness < 2mm  - discussed findings, prognosis  - recommend monitoring  Ophthalmic Meds Ordered this visit:  Meds ordered this encounter  Medications   aflibercept (EYLEA) SOLN 2 mg     Return in about  6 weeks (around 04/18/2023).  There are no Patient Instructions on file for this visit.   Explained the diagnoses, plan, and follow up with the patient and they expressed understanding.  Patient expressed understanding of the importance of proper follow up care.   This document serves as a record of services personally performed by Karie Chimera, MD, PhD. It was created on their  behalf by Glee Arvin. Manson Passey, OA an ophthalmic technician. The creation of this record is the provider's dictation and/or activities during the visit.    Electronically signed by: Glee Arvin. Manson Passey, OA 03/07/23 4:53 PM  Karie Chimera, M.D., Ph.D. Diseases & Surgery of the Retina and Vitreous [facility]  I have reviewed the above documentation for accuracy and completeness, and I agree with the above. Karie Chimera, M.D., Ph.D. 03/07/23 4:53 PM   Abbreviations: M myopia (nearsighted); A astigmatism; H hyperopia (farsighted); P presbyopia; Mrx spectacle prescription;  CTL contact lenses; OD right eye; OS left eye; OU both eyes  XT exotropia; ET esotropia; PEK punctate epithelial keratitis; PEE punctate epithelial erosions; DES dry eye syndrome; MGD meibomian gland dysfunction; ATs artificial tears; PFAT's preservative free artificial tears; NSC nuclear sclerotic cataract; PSC posterior subcapsular cataract; ERM epi-retinal membrane; PVD posterior vitreous detachment; RD retinal detachment; DM diabetes mellitus; DR diabetic retinopathy; NPDR non-proliferative diabetic retinopathy; PDR proliferative diabetic retinopathy; CSME clinically significant macular edema; DME diabetic macular edema; dbh dot blot hemorrhages; CWS cotton wool spot; POAG primary open angle glaucoma; C/D cup-to-disc ratio; HVF humphrey visual field; GVF goldmann visual field; OCT optical coherence tomography; IOP intraocular pressure; BRVO Branch retinal vein occlusion; CRVO central retinal vein occlusion; CRAO central retinal artery occlusion; BRAO branch retinal artery occlusion; RT retinal tear; SB scleral buckle; PPV pars plana vitrectomy; VH Vitreous hemorrhage; PRP panretinal laser photocoagulation; IVK intravitreal kenalog; VMT vitreomacular traction; MH Macular hole;  NVD neovascularization of the disc; NVE neovascularization elsewhere; AREDS age related eye disease study; ARMD age related macular  degeneration; POAG primary open angle glaucoma; EBMD epithelial/anterior basement membrane dystrophy; ACIOL anterior chamber intraocular lens; IOL intraocular lens; PCIOL posterior chamber intraocular lens; Phaco/IOL phacoemulsification with intraocular lens placement; PRK photorefractive keratectomy; LASIK laser assisted in situ keratomileusis; HTN hypertension; DM diabetes mellitus; COPD chronic obstructive pulmonary disease

## 2023-03-07 ENCOUNTER — Encounter (INDEPENDENT_AMBULATORY_CARE_PROVIDER_SITE_OTHER): Payer: Self-pay | Admitting: Ophthalmology

## 2023-03-07 ENCOUNTER — Ambulatory Visit (INDEPENDENT_AMBULATORY_CARE_PROVIDER_SITE_OTHER): Payer: BC Managed Care – PPO | Admitting: Ophthalmology

## 2023-03-07 DIAGNOSIS — H34831 Tributary (branch) retinal vein occlusion, right eye, with macular edema: Secondary | ICD-10-CM | POA: Diagnosis not present

## 2023-03-07 DIAGNOSIS — H43813 Vitreous degeneration, bilateral: Secondary | ICD-10-CM

## 2023-03-07 DIAGNOSIS — H35412 Lattice degeneration of retina, left eye: Secondary | ICD-10-CM

## 2023-03-07 DIAGNOSIS — H33322 Round hole, left eye: Secondary | ICD-10-CM | POA: Diagnosis not present

## 2023-03-07 DIAGNOSIS — H353132 Nonexudative age-related macular degeneration, bilateral, intermediate dry stage: Secondary | ICD-10-CM

## 2023-03-07 DIAGNOSIS — D3131 Benign neoplasm of right choroid: Secondary | ICD-10-CM

## 2023-03-07 DIAGNOSIS — Z961 Presence of intraocular lens: Secondary | ICD-10-CM

## 2023-03-07 MED ORDER — AFLIBERCEPT 2MG/0.05ML IZ SOLN FOR KALEIDOSCOPE
2.0000 mg | INTRAVITREAL | Status: AC | PRN
  Administered 2023-03-07: 2 mg via INTRAVITREAL

## 2023-04-07 NOTE — Progress Notes (Signed)
[facility] - Clinic Note  04/18/2023     CHIEF COMPLAINT Patient presents for Retina Follow Up   HISTORY OF PRESENT ILLNESS: Sara Glover is a 68 y.o. female who presents to the clinic today for:   HPI     Retina Follow Up   Patient presents with  CRVO/BRVO.  In right eye.  This started 6 weeks ago.  Duration of 6 weeks.  Since onset it is stable.  I, the attending physician,  performed the HPI with the patient and updated documentation appropriately.        Comments   6 week retina follow up BRVO OD and IVE OD pt is reporting no vision changes noticed she denies any flashes or floaters       Last edited by Rennis Chris, MD on 04/18/2023  4:59 PM.    Patient states vision is good  Referring physician: Isla Pence, OD [address] [place],  [place] [address]  HISTORICAL INFORMATION:   Selected notes from the MEDICAL RECORD NUMBER Referred by Dr. Clydene Pugh for concern of BRVO LEE:  Ocular Hx- PMH-    CURRENT MEDICATIONS: No current outpatient medications on file. (Ophthalmic Drugs)   No current facility-administered medications for this visit. (Ophthalmic Drugs)   Current Outpatient Medications (Other)  Medication Sig   lisinopril-hydrochlorothiazide (ZESTORETIC) 10-12.5 MG tablet Take 0.5 tablets by mouth daily.   No current facility-administered medications for this visit. (Other)   REVIEW OF SYSTEMS: ROS   Positive for: Cardiovascular, Eyes, Respiratory Negative for: Constitutional, Gastrointestinal, Neurological, Skin, Genitourinary, Musculoskeletal, HENT, Endocrine, Psychiatric, Allergic/Imm, Heme/Lymph Last edited by Etheleen Mayhew, COT on 04/18/2023  2:35 PM.       ALLERGIES Allergies  Allergen Reactions   Metformin     REACTION: diarrhea   PAST MEDICAL HISTORY Past Medical History:  Diagnosis Date   Diabetes mellitus 2004   type II   Hyperlipidemia 2004   Hypertension 2004   Morbid obesity (HCC)     Sleep apnea    Past Surgical History:  Procedure Laterality Date   bariactric surgery     CATARACT EXTRACTION     CESAREAN SECTION     CHOLECYSTECTOMY     FAMILY HISTORY Family History  Problem Relation Age of Onset   Diabetes Mother    Cancer Mother        colon CA   Schizophrenia Mother    Diabetes Father    Cancer Sister        ovarian CA in 8s   Diabetes Brother    SOCIAL HISTORY Social History   Tobacco Use   Smoking status: Never   Smokeless tobacco: Never  Vaping Use   Vaping status: Never Used  Substance Use Topics   Alcohol use: No   Drug use: No       OPHTHALMIC EXAM:  Base Eye Exam     Visual Acuity (Snellen - Linear)       Right Left   Dist [place] 20/20 -1 20/25   Dist ph cc  NI    Correction: Glasses         Tonometry (Tonopen, 2:38 PM)       Right Left   Pressure 20 19         Pupils       Pupils Dark Light Shape React APD   Right PERRL 3 2 Round Brisk None   Left PERRL 3 2 Round Brisk None  Visual Fields       Left Right    Full Full         Extraocular Movement       Right Left    Full, Ortho Full, Ortho         Neuro/Psych     Oriented x3: Yes   Mood/Affect: Normal         Dilation     Both eyes: 2.5% Phenylephrine @ 2:38 PM           Slit Lamp and Fundus Exam     Slit Lamp Exam       Right Left   Lids/Lashes Dermatochalasis - upper lid, mild MGD Dermatochalasis - upper lid, mild MGD   Conjunctiva/Sclera White and quiet Mild temporal pinguecula   Cornea trace PEE, Arcus, Well healed  cataract wound, Debris in tear film Mild tear film debris, well healed cataract wound   Anterior Chamber Deep and clear Deep and quiet   Iris Round and dilated Round and dilated   Lens PC IOL in good postition PC IOL good position   Anterior Vitreous Mild Vitreous syneresis, Posterior vitreous detachment, Vitreous condensations Mild Vitreous syneresis, Posterior vitreous detachment, vitreous condensations          Fundus Exam       Right Left   Disc mild pallor, Sharp rim mild Pallor, Sharp rim, PPA/PPP   C/D Ratio 0.2 0.3   Macula good foveal reflex, drusen, stable improvement in edema/cystic changes IN mac and fovea, no heme Flat, good foveal reflex, RPE mottling, No heme or edema   Vessels attenuated, Tortuous, IT BRVO attenuated, Tortuous   Periphery Attached, pigmented choroidal lesion with overlying drusen at 130, relatively flat, 1.5DD, No heme, No RT/RD Attached, pigmented lattice at 0100 with retinal holes - good laser surrounding, no new RT/RD/Lattice           Refraction     Wearing Rx       Sphere Cylinder Axis Add   Right -1.75 +0.75 008 +1.75   Left -2.25 +0.50 137 +1.75           IMAGING AND PROCEDURES  Imaging and Procedures for 04/18/2023  OCT, Retina - OU - Both Eyes       Right Eye Quality was good. Central Foveal Thickness: 231. Progression has been stable. Findings include normal foveal contour, no IRF, no SRF, retinal drusen , intraretinal hyper-reflective material (stable improvement / resolution of cystic changes/edema IN fovea and macula, stable improvement in foveal contour).   Left Eye Quality was good. Central Foveal Thickness: 252. Progression has been stable. Findings include normal foveal contour, no IRF, no SRF, myopic contour, retinal drusen .   Notes *Images captured and stored on drive  Diagnosis / Impression:  OD: inferior BRVO with CME -- stable improvement / resolution of cystic changes/edema IN fovea and macula, stable improvement in foveal contour OS: non-exu ARMD   Clinical management:  See below  Abbreviations: NFP - Normal foveal profile. CME - cystoid macular edema. PED - pigment epithelial detachment. IRF - intraretinal fluid. SRF - subretinal fluid. EZ - ellipsoid zone. ERM - epiretinal membrane. ORA - outer retinal atrophy. ORT - outer retinal tubulation. SRHM - subretinal hyper-reflective material. IRHM - intraretinal  hyper-reflective material      Intravitreal Injection, Pharmacologic Agent - OD - Right Eye       Time Out 04/18/2023. 3:56 PM. Confirmed correct patient, procedure, site, and patient consented.   Anesthesia Topical  anesthesia was used. Anesthetic medications included Lidocaine 2%, Proparacaine 0.5%.   Procedure Preparation included 5% betadine to ocular surface, eyelid speculum. A (32g) needle was used.   Injection: 2 mg aflibercept 2 MG/0.05ML   Route: Intravitreal, Site: Right Eye   NDC: L6038910, Lot: [id number], Expiration date: 07/19/2024, Waste: 0 mL   Post-op Post injection exam found visual acuity of at least counting fingers. The patient tolerated the procedure well. There were no complications. The patient received written and verbal post procedure care education. Post injection medications were not given.            ASSESSMENT/PLAN:    ICD-10-CM   1. Branch retinal vein occlusion of right eye with macular edema  H34.8310 OCT, Retina - OU - Both Eyes    Intravitreal Injection, Pharmacologic Agent - OD - Right Eye    aflibercept (EYLEA) SOLN 2 mg    2. Lattice degeneration of left retina  H35.412     3. Retinal hole of left eye  H33.322     4. Posterior vitreous detachment of both eyes  H43.813     5. Intermediate stage nonexudative age-related macular degeneration of both eyes  H35.3132     6. 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 BRVO w/ CME OD - s/p IVA OD #1 (02.14.23), #2 (03.14.23), #3 (04.11.23), #4 (05.26.23), #5 (06.23.23), #6 (07.28.23), #7 (09.11.23), #8 (11.14.23), #9 (12.29.23), #10 (02.12.24), #11 (04.02.24), #12 (05.28.24), #13 (07.08.24), #14 (08.19.24) -- IVA resistance - s/p IVE OD #1 (09.30.24), #2 (11.11.24), #3 (12.16.24) - BCVA OD 20/25 - stable - OCT shows OD: inferior BRVO with CME -- stable resolution of cystic changes/edema IN fovea and macula, stable improvement in foveal  contour at 6 wks - recommend IVE OD #4 today, 01.27.25 w/ f/u extended to 8 wks - pt wishes to proceed with injection - RBA of procedure discussed, questions answered - Avastin informed consent obtained and signed OD 07.08.24 - see procedure note  - Eylea approved for 2025 -- injection is covered by insurance co pay - F/U 8 weeks -- DFE/OCT/possible injection  2,3. Lattice degeneration w/ atrophic holes, left eye - pigmented lattice at 1230 with retinal holes - s/p laser retinopexy OS (02.21.23) - good laser surrounding - monitor  4. PVD / vitreous syneresis OU - OD with stable release of VMA to full PVD -- symptomatic flashes and floaters improved  - OS - stable PVD  - Discussed findings and prognosis  - No RT or RD on 360 exam OU  - Reviewed s/s of RT/RD  - strict return precautions for any such RT/RD symptoms  - monitor  5. Age related macular degeneration, non-exudative, both eyes - The incidence, anatomy, and pathology of dry AMD, risk of progression, and the AREDS and AREDS 2 study including smoking risks discussed with patient.   - continue Amsler grid monitoring  - monitor  6. Pseudophakia OU  - s/p CE/IOL OD (04.18.24, Dr. Wynelle Link)  - IOL in good position, doing well  - monitor  7. Choroidal Nevus, OD -- stable  - pigmented lesion at 01:30, 1.5DD  - no visual symptoms, SRF or orange pigment  - +drusen  - thickness < 2mm  - discussed findings, prognosis  - recommend monitoring  Ophthalmic Meds Ordered this visit:  Meds ordered this encounter  Medications   aflibercept (EYLEA) SOLN 2 mg  Return in about 8 weeks (around 06/13/2023) for f/u BRVO OD, DFE, OCT, Possible Injxn.  There are no Patient Instructions on file for this visit.   Explained the diagnoses, plan, and follow up with the patient and they expressed understanding.  Patient expressed understanding of the importance of proper follow up care.   This document serves as a record of services  personally performed by Karie Chimera, MD, PhD. It was created on their behalf by Glee Arvin. Manson Passey, OA an ophthalmic technician. The creation of this record is the provider's dictation and/or activities during the visit.    Electronically signed by: Glee Arvin. Manson Passey, OA 04/18/23 5:01 PM   Karie Chimera, M.D., Ph.D. Diseases & Surgery of the Retina and Vitreous [facility]  I have reviewed the above documentation for accuracy and completeness, and I agree with the above. Karie Chimera, M.D., Ph.D. 04/18/23 5:02 PM   Abbreviations: M myopia (nearsighted); A astigmatism; H hyperopia (farsighted); P presbyopia; Mrx spectacle prescription;  CTL contact lenses; OD right eye; OS left eye; OU both eyes  XT exotropia; ET esotropia; PEK punctate epithelial keratitis; PEE punctate epithelial erosions; DES dry eye syndrome; MGD meibomian gland dysfunction; ATs artificial tears; PFAT's preservative free artificial tears; NSC nuclear sclerotic cataract; PSC posterior subcapsular cataract; ERM epi-retinal membrane; PVD posterior vitreous detachment; RD retinal detachment; DM diabetes mellitus; DR diabetic retinopathy; NPDR non-proliferative diabetic retinopathy; PDR proliferative diabetic retinopathy; CSME clinically significant macular edema; DME diabetic macular edema; dbh dot blot hemorrhages; CWS cotton wool spot; POAG primary open angle glaucoma; C/D cup-to-disc ratio; HVF humphrey visual field; GVF goldmann visual field; OCT optical coherence tomography; IOP intraocular pressure; BRVO Branch retinal vein occlusion; CRVO central retinal vein occlusion; CRAO central retinal artery occlusion; BRAO branch retinal artery occlusion; RT retinal tear; SB scleral buckle; PPV pars plana vitrectomy; VH Vitreous hemorrhage; PRP panretinal laser photocoagulation; IVK intravitreal kenalog; VMT vitreomacular traction; MH Macular hole;  NVD neovascularization of the disc; NVE neovascularization  elsewhere; AREDS age related eye disease study; ARMD age related macular degeneration; POAG primary open angle glaucoma; EBMD epithelial/anterior basement membrane dystrophy; ACIOL anterior chamber intraocular lens; IOL intraocular lens; PCIOL posterior chamber intraocular lens; Phaco/IOL phacoemulsification with intraocular lens placement; PRK photorefractive keratectomy; LASIK laser assisted in situ keratomileusis; HTN hypertension; DM diabetes mellitus; COPD chronic obstructive pulmonary disease

## 2023-04-18 ENCOUNTER — Encounter (INDEPENDENT_AMBULATORY_CARE_PROVIDER_SITE_OTHER): Payer: Self-pay | Admitting: Ophthalmology

## 2023-04-18 ENCOUNTER — Ambulatory Visit (INDEPENDENT_AMBULATORY_CARE_PROVIDER_SITE_OTHER): Payer: 59 | Admitting: Ophthalmology

## 2023-04-18 DIAGNOSIS — H353132 Nonexudative age-related macular degeneration, bilateral, intermediate dry stage: Secondary | ICD-10-CM

## 2023-04-18 DIAGNOSIS — D3131 Benign neoplasm of right choroid: Secondary | ICD-10-CM | POA: Diagnosis not present

## 2023-04-18 DIAGNOSIS — H43813 Vitreous degeneration, bilateral: Secondary | ICD-10-CM | POA: Diagnosis not present

## 2023-04-18 DIAGNOSIS — H35412 Lattice degeneration of retina, left eye: Secondary | ICD-10-CM | POA: Diagnosis not present

## 2023-04-18 DIAGNOSIS — H34831 Tributary (branch) retinal vein occlusion, right eye, with macular edema: Secondary | ICD-10-CM | POA: Diagnosis not present

## 2023-04-18 DIAGNOSIS — H33322 Round hole, left eye: Secondary | ICD-10-CM | POA: Diagnosis not present

## 2023-04-18 DIAGNOSIS — Z961 Presence of intraocular lens: Secondary | ICD-10-CM

## 2023-04-18 MED ORDER — AFLIBERCEPT 2MG/0.05ML IZ SOLN FOR KALEIDOSCOPE
2.0000 mg | INTRAVITREAL | Status: AC | PRN
  Administered 2023-04-18: 2 mg via INTRAVITREAL

## 2023-06-10 NOTE — Progress Notes (Signed)
 [facility] - Clinic Note  06/15/2023     CHIEF COMPLAINT Patient presents for Retina Follow Up   HISTORY OF PRESENT ILLNESS: Sara Glover is a 68 y.o. female who presents to the clinic today for:   HPI     Retina Follow Up   Patient presents with  CRVO/BRVO.  In right eye.  This started 8 weeks ago.  Duration of 8 weeks.  Since onset it is stable.  I, the attending physician,  performed the HPI with the patient and updated documentation appropriately.        Comments   8 week retina follow up BRVO OD and I'VE OD pt is reporting no vision changes noticed she denies any flashes has some floaters       Last edited by Rennis Chris, MD on 06/15/2023  5:31 PM.     Patient states vision is good  Referring physician: Isla Pence, OD [address] [place],  [place] [address]  HISTORICAL INFORMATION:   Selected notes from the MEDICAL RECORD NUMBER Referred by Dr. Clydene Pugh for concern of BRVO LEE:  Ocular Hx- PMH-    CURRENT MEDICATIONS: No current outpatient medications on file. (Ophthalmic Drugs)   No current facility-administered medications for this visit. (Ophthalmic Drugs)   Current Outpatient Medications (Other)  Medication Sig   lisinopril-hydrochlorothiazide (ZESTORETIC) 10-12.5 MG tablet Take 0.5 tablets by mouth daily.   No current facility-administered medications for this visit. (Other)   REVIEW OF SYSTEMS: ROS   Positive for: Cardiovascular, Eyes, Respiratory Negative for: Constitutional, Gastrointestinal, Neurological, Skin, Genitourinary, Musculoskeletal, HENT, Endocrine, Psychiatric, Allergic/Imm, Heme/Lymph Last edited by Etheleen Mayhew, COT on 06/15/2023  2:39 PM.        ALLERGIES Allergies  Allergen Reactions   Metformin     REACTION: diarrhea   PAST MEDICAL HISTORY Past Medical History:  Diagnosis Date   Diabetes mellitus 2004   type II   Hyperlipidemia 2004   Hypertension 2004   Morbid obesity  (HCC)    Sleep apnea    Past Surgical History:  Procedure Laterality Date   bariactric surgery     CATARACT EXTRACTION     CESAREAN SECTION     CHOLECYSTECTOMY     FAMILY HISTORY Family History  Problem Relation Age of Onset   Diabetes Mother    Cancer Mother        colon CA   Schizophrenia Mother    Diabetes Father    Cancer Sister        ovarian CA in 53s   Diabetes Brother    SOCIAL HISTORY Social History   Tobacco Use   Smoking status: Never   Smokeless tobacco: Never  Vaping Use   Vaping status: Never Used  Substance Use Topics   Alcohol use: No   Drug use: No       OPHTHALMIC EXAM:  Base Eye Exam     Visual Acuity (Snellen - Linear)       Right Left   Dist cc 20/25 20/30   Dist ph cc NI NI    Correction: Glasses         Tonometry (Tonopen, 2:44 PM)       Right Left   Pressure 16 18         Pupils       Pupils Dark Light Shape React APD   Right PERRL 3 2 Round Brisk None   Left PERRL 3 2 Round Brisk None  Visual Fields       Left Right    Full Full         Extraocular Movement       Right Left    Full, Ortho Full, Ortho         Neuro/Psych     Oriented x3: Yes   Mood/Affect: Normal         Dilation     Both eyes: 2.5% Phenylephrine @ 2:45 PM           Slit Lamp and Fundus Exam     Slit Lamp Exam       Right Left   Lids/Lashes Dermatochalasis - upper lid, mild MGD Dermatochalasis - upper lid, mild MGD   Conjunctiva/Sclera White and quiet Mild temporal pinguecula   Cornea trace PEE, Arcus, Well healed  cataract wound, Debris in tear film Mild tear film debris, well healed cataract wound   Anterior Chamber Deep and clear Deep and quiet   Iris Round and dilated Round and dilated   Lens PC IOL in good postition PC IOL good position   Anterior Vitreous Mild Vitreous syneresis, Posterior vitreous detachment, Vitreous condensations Mild Vitreous syneresis, Posterior vitreous detachment, vitreous  condensations         Fundus Exam       Right Left   Disc mild pallor, Sharp rim mild Pallor, Sharp rim, PPA/PPP   C/D Ratio 0.2 0.3   Macula good foveal reflex, drusen, stable improvement in edema/cystic changes IN mac and fovea, no heme Flat, good foveal reflex, RPE mottling, No heme or edema   Vessels attenuated, Tortuous, IT BRVO attenuated, Tortuous   Periphery Attached, pigmented choroidal lesion with overlying drusen at 130, relatively flat, 1.5DD, No heme, No RT/RD Attached, pigmented lattice at 0100 with retinal holes - good laser surrounding, no new RT/RD/Lattice           Refraction     Wearing Rx       Sphere Cylinder Axis Add   Right -1.75 +0.75 008 +1.75   Left -2.25 +0.50 137 +1.75           IMAGING AND PROCEDURES  Imaging and Procedures for 06/15/2023  OCT, Retina - OU - Both Eyes       Right Eye Quality was good. Central Foveal Thickness: 228. Progression has been stable. Findings include normal foveal contour, no IRF, no SRF, retinal drusen , intraretinal hyper-reflective material (stable improvement / resolution of cystic changes/edema IN fovea and macula, stable improvement in foveal contour).   Left Eye Quality was good. Central Foveal Thickness: 248. Progression has been stable. Findings include normal foveal contour, no IRF, no SRF, myopic contour, retinal drusen .   Notes *Images captured and stored on drive  Diagnosis / Impression:  OD: inferior BRVO with CME -- stable improvement / resolution of cystic changes/edema IN fovea and macula, stable improvement in foveal contour OS: non-exu ARMD   Clinical management:  See below  Abbreviations: NFP - Normal foveal profile. CME - cystoid macular edema. PED - pigment epithelial detachment. IRF - intraretinal fluid. SRF - subretinal fluid. EZ - ellipsoid zone. ERM - epiretinal membrane. ORA - outer retinal atrophy. ORT - outer retinal tubulation. SRHM - subretinal hyper-reflective material. IRHM -  intraretinal hyper-reflective material      Intravitreal Injection, Pharmacologic Agent - OD - Right Eye       Time Out 06/15/2023. 3:37 PM. Confirmed correct patient, procedure, site, and patient consented.   Anesthesia Topical  anesthesia was used. Anesthetic medications included Lidocaine 2%, Proparacaine 0.5%.   Procedure Preparation included 5% betadine to ocular surface, eyelid speculum. A (32g) needle was used.   Injection: 2 mg aflibercept 2 MG/0.05ML   Route: Intravitreal, Site: Right Eye   NDC: L6038910, Lot: [id number], Expiration date: 08/18/2024, Waste: 0 mL   Post-op Post injection exam found visual acuity of at least counting fingers. The patient tolerated the procedure well. There were no complications. The patient received written and verbal post procedure care education. Post injection medications were not given.            ASSESSMENT/PLAN:    ICD-10-CM   1. Branch retinal vein occlusion of right eye with macular edema  H34.8310 OCT, Retina - OU - Both Eyes    Intravitreal Injection, Pharmacologic Agent - OD - Right Eye    aflibercept (EYLEA) SOLN 2 mg    2. Lattice degeneration of left retina  H35.412     3. Retinal hole of left eye  H33.322     4. Posterior vitreous detachment of both eyes  H43.813     5. Intermediate stage nonexudative age-related macular degeneration of both eyes  H35.3132     6. 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 BRVO w/ CME OD - s/p IVA OD #1 (02.14.23), #2 (03.14.23), #3 (04.11.23), #4 (05.26.23), #5 (06.23.23), #6 (07.28.23), #7 (09.11.23), #8 (11.14.23), #9 (12.29.23), #10 (02.12.24), #11 (04.02.24), #12 (05.28.24), #13 (07.08.24), #14 (08.19.24) -- IVA resistance - s/p IVE OD #1 (09.30.24), #2 (11.11.24), #3 (12.16.24), #4 (01.27.25) - BCVA OD 20/25 - stable - OCT shows OD: inferior BRVO with CME -- stable improvement / resolution of cystic changes/edema IN fovea and  macula, stable improvement in foveal contour at 8 wks - recommend IVE OD #5 today, 03.26.25 w/ f/u extended to 10 wks - pt wishes to proceed with injection - RBA of procedure discussed, questions answered - Eylea informed consent obtained and signed OD 0.30.24 - see procedure note  - Eylea approved for 2025 -- injection is covered by insurance co pay - F/U 10 weeks -- DFE/OCT/possible injection  2,3. Lattice degeneration w/ atrophic holes, left eye - pigmented lattice at 1230 with retinal holes - s/p laser retinopexy OS (02.21.23) - good laser surrounding - monitor  4. PVD / vitreous syneresis OU - OD with stable release of VMA to full PVD -- symptomatic flashes and floaters improved  - OS - stable PVD  - Discussed findings and prognosis  - No RT or RD on 360 exam OU  - Reviewed s/s of RT/RD  - strict return precautions for any such RT/RD symptoms  - monitor  5. Age related macular degeneration, non-exudative, both eyes - The incidence, anatomy, and pathology of dry AMD, risk of progression, and the AREDS and AREDS 2 study including smoking risks discussed with patient.   - continue Amsler grid monitoring  - monitor  6. Pseudophakia OU  - s/p CE/IOL OD (04.18.24, Dr. Wynelle Link)  - IOL in good position, doing well  - monitor  7. Choroidal Nevus, OD -- stable  - pigmented lesion at 01:30, 1.5DD  - no visual symptoms, SRF or orange pigment  - +drusen  - thickness < 2mm  - discussed findings, prognosis  - recommend monitoring  Ophthalmic Meds Ordered this visit:  Meds ordered this encounter  Medications   aflibercept (EYLEA) SOLN 2 mg  Return in about 10 weeks (around 08/24/2023) for f/u BRVO OD, DFE, OCT, Possible Injxn.  There are no Patient Instructions on file for this visit.   Explained the diagnoses, plan, and follow up with the patient and they expressed understanding.  Patient expressed understanding of the importance of proper follow up care.   This document  serves as a record of services personally performed by Karie Chimera, MD, PhD. It was created on their behalf by Glee Arvin. Manson Passey, OA an ophthalmic technician. The creation of this record is the provider's dictation and/or activities during the visit.    Electronically signed by: Glee Arvin. Manson Passey, OA 06/16/23 2:48 PM  Karie Chimera, M.D., Ph.D. Diseases & Surgery of the Retina and Vitreous [facility]  I have reviewed the above documentation for accuracy and completeness, and I agree with the above. Karie Chimera, M.D., Ph.D. 06/16/23 2:48 PM   Abbreviations: M myopia (nearsighted); A astigmatism; H hyperopia (farsighted); P presbyopia; Mrx spectacle prescription;  CTL contact lenses; OD right eye; OS left eye; OU both eyes  XT exotropia; ET esotropia; PEK punctate epithelial keratitis; PEE punctate epithelial erosions; DES dry eye syndrome; MGD meibomian gland dysfunction; ATs artificial tears; PFAT's preservative free artificial tears; NSC nuclear sclerotic cataract; PSC posterior subcapsular cataract; ERM epi-retinal membrane; PVD posterior vitreous detachment; RD retinal detachment; DM diabetes mellitus; DR diabetic retinopathy; NPDR non-proliferative diabetic retinopathy; PDR proliferative diabetic retinopathy; CSME clinically significant macular edema; DME diabetic macular edema; dbh dot blot hemorrhages; CWS cotton wool spot; POAG primary open angle glaucoma; C/D cup-to-disc ratio; HVF humphrey visual field; GVF goldmann visual field; OCT optical coherence tomography; IOP intraocular pressure; BRVO Branch retinal vein occlusion; CRVO central retinal vein occlusion; CRAO central retinal artery occlusion; BRAO branch retinal artery occlusion; RT retinal tear; SB scleral buckle; PPV pars plana vitrectomy; VH Vitreous hemorrhage; PRP panretinal laser photocoagulation; IVK intravitreal kenalog; VMT vitreomacular traction; MH Macular hole;  NVD neovascularization of the disc;  NVE neovascularization elsewhere; AREDS age related eye disease study; ARMD age related macular degeneration; POAG primary open angle glaucoma; EBMD epithelial/anterior basement membrane dystrophy; ACIOL anterior chamber intraocular lens; IOL intraocular lens; PCIOL posterior chamber intraocular lens; Phaco/IOL phacoemulsification with intraocular lens placement; PRK photorefractive keratectomy; LASIK laser assisted in situ keratomileusis; HTN hypertension; DM diabetes mellitus; COPD chronic obstructive pulmonary disease

## 2023-06-13 ENCOUNTER — Encounter (INDEPENDENT_AMBULATORY_CARE_PROVIDER_SITE_OTHER): Payer: 59 | Admitting: Ophthalmology

## 2023-06-13 DIAGNOSIS — H35412 Lattice degeneration of retina, left eye: Secondary | ICD-10-CM

## 2023-06-13 DIAGNOSIS — Z961 Presence of intraocular lens: Secondary | ICD-10-CM

## 2023-06-13 DIAGNOSIS — H33322 Round hole, left eye: Secondary | ICD-10-CM

## 2023-06-13 DIAGNOSIS — H43813 Vitreous degeneration, bilateral: Secondary | ICD-10-CM

## 2023-06-13 DIAGNOSIS — H353132 Nonexudative age-related macular degeneration, bilateral, intermediate dry stage: Secondary | ICD-10-CM

## 2023-06-13 DIAGNOSIS — D3131 Benign neoplasm of right choroid: Secondary | ICD-10-CM

## 2023-06-13 DIAGNOSIS — H34831 Tributary (branch) retinal vein occlusion, right eye, with macular edema: Secondary | ICD-10-CM

## 2023-06-15 ENCOUNTER — Encounter (INDEPENDENT_AMBULATORY_CARE_PROVIDER_SITE_OTHER): Payer: Self-pay | Admitting: Ophthalmology

## 2023-06-15 ENCOUNTER — Ambulatory Visit (INDEPENDENT_AMBULATORY_CARE_PROVIDER_SITE_OTHER): Admitting: Ophthalmology

## 2023-06-15 DIAGNOSIS — D3131 Benign neoplasm of right choroid: Secondary | ICD-10-CM | POA: Diagnosis not present

## 2023-06-15 DIAGNOSIS — H34831 Tributary (branch) retinal vein occlusion, right eye, with macular edema: Secondary | ICD-10-CM | POA: Diagnosis not present

## 2023-06-15 DIAGNOSIS — H35412 Lattice degeneration of retina, left eye: Secondary | ICD-10-CM

## 2023-06-15 DIAGNOSIS — H353132 Nonexudative age-related macular degeneration, bilateral, intermediate dry stage: Secondary | ICD-10-CM

## 2023-06-15 DIAGNOSIS — H43813 Vitreous degeneration, bilateral: Secondary | ICD-10-CM

## 2023-06-15 DIAGNOSIS — H33322 Round hole, left eye: Secondary | ICD-10-CM

## 2023-06-15 DIAGNOSIS — Z961 Presence of intraocular lens: Secondary | ICD-10-CM

## 2023-06-15 MED ORDER — AFLIBERCEPT 2MG/0.05ML IZ SOLN FOR KALEIDOSCOPE
2.0000 mg | INTRAVITREAL | Status: AC | PRN
  Administered 2023-06-15: 2 mg via INTRAVITREAL

## 2023-08-11 NOTE — Progress Notes (Shared)
 [facility] - Clinic Note  08/22/2023     CHIEF COMPLAINT Patient presents for No chief complaint on file.   HISTORY OF PRESENT ILLNESS: Sara Glover is a 68 y.o. female who presents to the clinic today for:     Patient states vision is good  Referring physician: Julia Oats, OD [address] [place],  [place] [address]  HISTORICAL INFORMATION:   Selected notes from the MEDICAL RECORD NUMBER Referred by Dr. Alto Atta for concern of BRVO LEE:  Ocular Hx- PMH-    CURRENT MEDICATIONS: No current outpatient medications on file. (Ophthalmic Drugs)   No current facility-administered medications for this visit. (Ophthalmic Drugs)   Current Outpatient Medications (Other)  Medication Sig   lisinopril -hydrochlorothiazide (ZESTORETIC) 10-12.5 MG tablet Take 0.5 tablets by mouth daily.   No current facility-administered medications for this visit. (Other)   REVIEW OF SYSTEMS:      ALLERGIES Allergies  Allergen Reactions   Metformin     REACTION: diarrhea   PAST MEDICAL HISTORY Past Medical History:  Diagnosis Date   Diabetes mellitus 2004   type II   Hyperlipidemia 2004   Hypertension 2004   Morbid obesity (HCC)    Sleep apnea    Past Surgical History:  Procedure Laterality Date   bariactric surgery     CATARACT EXTRACTION     CESAREAN SECTION     CHOLECYSTECTOMY     FAMILY HISTORY Family History  Problem Relation Age of Onset   Diabetes Mother    Cancer Mother        colon CA   Schizophrenia Mother    Diabetes Father    Cancer Sister        ovarian CA in 21s   Diabetes Brother    SOCIAL HISTORY Social History   Tobacco Use   Smoking status: Never   Smokeless tobacco: Never  Vaping Use   Vaping status: Never Used  Substance Use Topics   Alcohol use: No   Drug use: No       OPHTHALMIC EXAM:  Not recorded    IMAGING AND PROCEDURES  Imaging and Procedures for 08/22/2023          ASSESSMENT/PLAN:     ICD-10-CM   1. Branch retinal vein occlusion of right eye with macular edema  H34.8310     2. Lattice degeneration of left retina  H35.412     3. Retinal hole of left eye  H33.322     4. Posterior vitreous detachment of both eyes  H43.813     5. Intermediate stage nonexudative age-related macular degeneration of both eyes  H35.3132     6. 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 BRVO w/ CME OD - s/p IVA OD #1 (02.14.23), #2 (03.14.23), #3 (04.11.23), #4 (05.26.23), #5 (06.23.23), #6 (07.28.23), #7 (09.11.23), #8 (11.14.23), #9 (12.29.23), #10 (02.12.24), #11 (04.02.24), #12 (05.28.24), #13 (07.08.24), #14 (08.19.24) -- IVA resistance - s/p IVE OD #1 (09.30.24), #2 (11.11.24), #3 (12.16.24), #4 (01.27.25), #5 (03.26.25) - BCVA OD 20/25 - stable - OCT shows OD: inferior BRVO with CME -- stable improvement / resolution of cystic changes/edema IN fovea and macula, stable improvement in foveal contour at 8 wks - recommend IVE OD #6 today, 06.02.25 w/ f/u extended to 10 wks - pt wishes to proceed with injection - RBA of procedure discussed, questions answered -  Eylea  informed consent obtained and signed OD 0.30.24 - see procedure note  - Eylea  approved for 2025 -- injection is covered by insurance co pay - F/U 10 weeks -- DFE/OCT/possible injection  2,3. Lattice degeneration w/ atrophic holes, left eye - pigmented lattice at 1230 with retinal holes - s/p laser retinopexy OS (02.21.23) - good laser surrounding - monitor - monitor  4. PVD / vitreous syneresis OU - OD with stable release of VMA to full PVD -- symptomatic flashes and floaters improved  - OS - stable PVD  - Discussed findings and prognosis  - No RT or RD on 360 exam OU  - Reviewed s/s of RT/RD  - strict return precautions for any such RT/RD symptoms  - monitor  - monitor  5. Age related macular degeneration, non-exudative, both eyes - The incidence, anatomy, and  pathology of dry AMD, risk of progression, and the AREDS and AREDS 2 study including smoking risks discussed with patient.   - continue Amsler grid monitoring  - monitor  - monitor  6. Pseudophakia OU  - s/p CE/IOL OD (04.18.24, Dr. Paulene Boron)  - IOL in good position, doing well  - monitor  - monitor  7. Choroidal Nevus, OD -- stable  - pigmented lesion at 01:30, 1.5DD  - no visual symptoms, SRF or orange pigment  - +drusen  - thickness < 2mm  - discussed findings, prognosis  - monitor  Ophthalmic Meds Ordered this visit:  No orders of the defined types were placed in this encounter.    No follow-ups on file.  There are no Patient Instructions on file for this visit.   Explained the diagnoses, plan, and follow up with the patient and they expressed understanding.  Patient expressed understanding of the importance of proper follow up care.   This document serves as a record of services personally performed by Jeanice Millard, MD, PhD. It was created on their behalf by Mayola Specking, COA an ophthalmic technician. The creation of this record is the provider's dictation and/or activities during the visit.   Electronically signed by: Carrington Clack, COT  08/11/23  9:56 AM     Jeanice Millard, M.D., Ph.D. Diseases & Surgery of the Retina and Vitreous [facility]     Abbreviations: M myopia (nearsighted); A astigmatism; H hyperopia (farsighted); P presbyopia; Mrx spectacle prescription;  CTL contact lenses; OD right eye; OS left eye; OU both eyes  XT exotropia; ET esotropia; PEK punctate epithelial keratitis; PEE punctate epithelial erosions; DES dry eye syndrome; MGD meibomian gland dysfunction; ATs artificial tears; PFAT's preservative free artificial tears; NSC nuclear sclerotic cataract; PSC posterior subcapsular cataract; ERM epi-retinal membrane; PVD posterior vitreous detachment; RD retinal detachment; DM diabetes mellitus; DR diabetic retinopathy; NPDR  non-proliferative diabetic retinopathy; PDR proliferative diabetic retinopathy; CSME clinically significant macular edema; DME diabetic macular edema; dbh dot blot hemorrhages; CWS cotton wool spot; POAG primary open angle glaucoma; C/D cup-to-disc ratio; HVF humphrey visual field; GVF goldmann visual field; OCT optical coherence tomography; IOP intraocular pressure; BRVO Branch retinal vein occlusion; CRVO central retinal vein occlusion; CRAO central retinal artery occlusion; BRAO branch retinal artery occlusion; RT retinal tear; SB scleral buckle; PPV pars plana vitrectomy; VH Vitreous hemorrhage; PRP panretinal laser photocoagulation; IVK intravitreal kenalog; VMT vitreomacular traction; MH Macular hole;  NVD neovascularization of the disc; NVE neovascularization elsewhere; AREDS age related eye disease study; ARMD age related macular degeneration; POAG primary open angle glaucoma; EBMD epithelial/anterior basement membrane dystrophy; ACIOL  anterior chamber intraocular lens; IOL intraocular lens; PCIOL posterior chamber intraocular lens; Phaco/IOL phacoemulsification with intraocular lens placement; PRK photorefractive keratectomy; LASIK laser assisted in situ keratomileusis; HTN hypertension; DM diabetes mellitus; COPD chronic obstructive pulmonary disease

## 2023-08-22 ENCOUNTER — Encounter (INDEPENDENT_AMBULATORY_CARE_PROVIDER_SITE_OTHER): Admitting: Ophthalmology

## 2023-08-22 DIAGNOSIS — H35412 Lattice degeneration of retina, left eye: Secondary | ICD-10-CM

## 2023-08-22 DIAGNOSIS — H34831 Tributary (branch) retinal vein occlusion, right eye, with macular edema: Secondary | ICD-10-CM

## 2023-08-22 DIAGNOSIS — D3131 Benign neoplasm of right choroid: Secondary | ICD-10-CM

## 2023-08-22 DIAGNOSIS — Z961 Presence of intraocular lens: Secondary | ICD-10-CM

## 2023-08-22 DIAGNOSIS — H33322 Round hole, left eye: Secondary | ICD-10-CM

## 2023-08-22 DIAGNOSIS — H43813 Vitreous degeneration, bilateral: Secondary | ICD-10-CM

## 2023-08-22 DIAGNOSIS — H353132 Nonexudative age-related macular degeneration, bilateral, intermediate dry stage: Secondary | ICD-10-CM

## 2023-08-26 ENCOUNTER — Encounter (INDEPENDENT_AMBULATORY_CARE_PROVIDER_SITE_OTHER): Admitting: Ophthalmology

## 2023-08-26 ENCOUNTER — Encounter (INDEPENDENT_AMBULATORY_CARE_PROVIDER_SITE_OTHER): Payer: Self-pay

## 2023-09-06 ENCOUNTER — Encounter (INDEPENDENT_AMBULATORY_CARE_PROVIDER_SITE_OTHER): Payer: Self-pay | Admitting: Ophthalmology

## 2023-09-06 ENCOUNTER — Ambulatory Visit (INDEPENDENT_AMBULATORY_CARE_PROVIDER_SITE_OTHER): Admitting: Ophthalmology

## 2023-09-06 DIAGNOSIS — D3131 Benign neoplasm of right choroid: Secondary | ICD-10-CM

## 2023-09-06 DIAGNOSIS — H34831 Tributary (branch) retinal vein occlusion, right eye, with macular edema: Secondary | ICD-10-CM | POA: Diagnosis not present

## 2023-09-06 DIAGNOSIS — H33322 Round hole, left eye: Secondary | ICD-10-CM | POA: Diagnosis not present

## 2023-09-06 DIAGNOSIS — H43813 Vitreous degeneration, bilateral: Secondary | ICD-10-CM

## 2023-09-06 DIAGNOSIS — H35412 Lattice degeneration of retina, left eye: Secondary | ICD-10-CM

## 2023-09-06 DIAGNOSIS — H353132 Nonexudative age-related macular degeneration, bilateral, intermediate dry stage: Secondary | ICD-10-CM

## 2023-09-06 DIAGNOSIS — H33302 Unspecified retinal break, left eye: Secondary | ICD-10-CM

## 2023-09-06 DIAGNOSIS — Z961 Presence of intraocular lens: Secondary | ICD-10-CM

## 2023-09-06 MED ORDER — AFLIBERCEPT 2MG/0.05ML IZ SOLN FOR KALEIDOSCOPE
2.0000 mg | INTRAVITREAL | Status: AC | PRN
Start: 2023-09-06 — End: 2023-09-06
  Administered 2023-09-06: 2 mg via INTRAVITREAL

## 2023-09-06 NOTE — Progress Notes (Incomplete)
 [facility] - Clinic Note  09/06/2023     CHIEF COMPLAINT Patient presents for Retina Follow Up   HISTORY OF PRESENT ILLNESS: Sara Glover is a 68 y.o. female who presents to the clinic today for:   HPI     Retina Follow Up           Diagnosis: CRVO/BRVO   Laterality: right eye   Onset: 10 weeks ago         Comments   Patient here for 10 weeks retina follow up for BRVO OD. Patient states vision  doing ok. No eye pain.       Last edited by Sylvan Evener, COA on 09/06/2023  2:38 PM.     Patient states vision is good  Referring physician: Julia Oats, OD [address] [place],  [place] [address]  HISTORICAL INFORMATION:   Selected notes from the MEDICAL RECORD NUMBER Referred by Dr. Alto Atta for concern of BRVO LEE:  Ocular Hx- PMH-    CURRENT MEDICATIONS: No current outpatient medications on file. (Ophthalmic Drugs)   No current facility-administered medications for this visit. (Ophthalmic Drugs)   Current Outpatient Medications (Other)  Medication Sig   lisinopril -hydrochlorothiazide (ZESTORETIC) 10-12.5 MG tablet Take 0.5 tablets by mouth daily.   No current facility-administered medications for this visit. (Other)   REVIEW OF SYSTEMS: ROS   Positive for: Cardiovascular, Eyes, Respiratory Negative for: Constitutional, Gastrointestinal, Neurological, Skin, Genitourinary, Musculoskeletal, HENT, Endocrine, Psychiatric, Allergic/Imm, Heme/Lymph Last edited by Sylvan Evener, COA on 09/06/2023  2:38 PM.        ALLERGIES Allergies  Allergen Reactions   Metformin     REACTION: diarrhea   PAST MEDICAL HISTORY Past Medical History:  Diagnosis Date   Diabetes mellitus 2004   type II   Hyperlipidemia 2004   Hypertension 2004   Morbid obesity (HCC)    Sleep apnea    Past Surgical History:  Procedure Laterality Date   bariactric surgery     CATARACT EXTRACTION     CESAREAN SECTION     CHOLECYSTECTOMY      FAMILY HISTORY Family History  Problem Relation Age of Onset   Diabetes Mother    Cancer Mother        colon CA   Schizophrenia Mother    Diabetes Father    Cancer Sister        ovarian CA in 94s   Diabetes Brother    SOCIAL HISTORY Social History   Tobacco Use   Smoking status: Never   Smokeless tobacco: Never  Vaping Use   Vaping status: Never Used  Substance Use Topics   Alcohol use: No   Drug use: No       OPHTHALMIC EXAM:  Base Eye Exam     Visual Acuity (Snellen - Linear)       Right Left   Dist cc 20/25 -2 20/20 -1    Correction: Glasses         Tonometry (Tonopen, 2:36 PM)       Right Left   Pressure 18 14         Pupils       Dark Light Shape React APD   Right 3 2 Round Brisk None   Left 3 2 Round Brisk None         Visual Fields (Counting fingers)       Left Right    Full Full  Extraocular Movement       Right Left    Full, Ortho Full, Ortho         Neuro/Psych     Oriented x3: Yes   Mood/Affect: Normal         Dilation     Both eyes: 1.0% Mydriacyl, 2.5% Phenylephrine @ 2:36 PM           Slit Lamp and Fundus Exam     Slit Lamp Exam       Right Left   Lids/Lashes Dermatochalasis - upper lid, mild MGD Dermatochalasis - upper lid, mild MGD   Conjunctiva/Sclera White and quiet Mild temporal pinguecula   Cornea trace PEE, Arcus, Well healed  cataract wound, Debris in tear film Mild tear film debris, well healed cataract wound   Anterior Chamber Deep and clear Deep and quiet   Iris Round and dilated Round and dilated   Lens PC IOL in good postition PC IOL good position   Vitreous Mild Vitreous syneresis, Posterior vitreous detachment, Vitreous condensations Mild Vitreous syneresis, Posterior vitreous detachment, vitreous condensations         Fundus Exam       Right Left   Disc mild pallor, Sharp rim mild Pallor, Sharp rim, PPA/PPP   C/D Ratio 0.2 0.3   Macula good foveal reflex, drusen,  stable improvement in edema/cystic changes IN mac and fovea, no heme Flat, good foveal reflex, RPE mottling, No heme or edema   Vessels attenuated, Tortuous, IT BRVO attenuated, Tortuous   Periphery Attached, pigmented choroidal lesion with overlying drusen at 130, relatively flat, 1.5DD, No heme, No RT/RD Attached, pigmented lattice at 0100 with retinal holes - good laser surrounding, no new RT/RD/Lattice           Refraction     Wearing Rx       Sphere Cylinder Axis Add   Right -1.75 +0.75 008 +1.75   Left -2.25 +0.50 137 +1.75           IMAGING AND PROCEDURES  Imaging and Procedures for 09/06/2023  OCT, Retina - OU - Both Eyes       Right Eye Quality was good. Central Foveal Thickness: 234. Progression has worsened. Findings include normal foveal contour, no IRF, no SRF, retinal drusen , intraretinal hyper-reflective material (Interval increase in cystic changes/edema IN fovea and macula, stable improvement in foveal contour).   Left Eye Quality was good. Central Foveal Thickness: 251. Progression has been stable. Findings include normal foveal contour, no IRF, no SRF, myopic contour, retinal drusen .   Notes *Images captured and stored on drive  Diagnosis / Impression:  OD: inferior BRVO with CME -- stable improvement / resolution of cystic changes/edema IN fovea and macula, stable improvement in foveal contour OS: non-exu ARMD   Clinical management:  See below  Abbreviations: NFP - Normal foveal profile. CME - cystoid macular edema. PED - pigment epithelial detachment. IRF - intraretinal fluid. SRF - subretinal fluid. EZ - ellipsoid zone. ERM - epiretinal membrane. ORA - outer retinal atrophy. ORT - outer retinal tubulation. SRHM - subretinal hyper-reflective material. IRHM - intraretinal hyper-reflective material      Intravitreal Injection, Pharmacologic Agent - OD - Right Eye       Time Out 09/06/2023. 4:08 PM. Confirmed correct patient, procedure, site, and  patient consented.   Anesthesia Topical anesthesia was used. Anesthetic medications included Lidocaine 2%, Proparacaine 0.5%.   Procedure Preparation included 5% betadine to ocular surface, eyelid speculum. A (32g) needle  was used.   Injection: 2 mg aflibercept  2 MG/0.05ML   Route: Intravitreal, Site: Right Eye   NDC: [id number], Lot: [id number], Expiration date: 11/18/2024, Waste: 0 mL   Post-op Post injection exam found visual acuity of at least counting fingers. The patient tolerated the procedure well. There were no complications. The patient received written and verbal post procedure care education. Post injection medications were not given.             ASSESSMENT/PLAN:    ICD-10-CM   1. Branch retinal vein occlusion of right eye with macular edema  H34.8310 OCT, Retina - OU - Both Eyes    Intravitreal Injection, Pharmacologic Agent - OD - Right Eye    2. Lattice degeneration of left retina  H35.412     3. Retinal hole of left eye  H33.322     4. Posterior vitreous detachment of both eyes  H43.813     5. Intermediate stage nonexudative age-related macular degeneration of both eyes  H35.3132     6. Pseudophakia of both eyes  Z96.1     7. 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 Left retinal defect  H33.302      1. BRVO w/ CME OD  - delayed to follow up from 10 weeks to 12 weeks (03.26.25 - 06.17.25) - s/p IVA OD #1 (02.14.23), #2 (03.14.23), #3 (04.11.23), #4 (05.26.23), #5 (06.23.23), #6 (07.28.23), #7 (09.11.23), #8 (11.14.23), #9 (12.29.23), #10 (02.12.24), #11 (04.02.24), #12 (05.28.24), #13 (07.08.24), #14 (08.19.24) -- IVA resistance - s/p IVE OD #1 (09.30.24), #2 (11.11.24), #3 (12.16.24), #4 (01.27.25), #5 (03.26.25) **hx of increased IRF at 12 weeks on 06.17.25 exam (IVE)** - BCVA OD 20/25 - stable - OCT shows OD: inferior BRVO with CME -- stable improvement / resolution of cystic changes/edema IN fovea and macula, stable improvement in foveal contour  at 12 wks - recommend IVE OD #6 today, 06.17.25 w/ f/u back to 9-10 wks - pt wishes to proceed with injection - RBA of procedure discussed, questions answered - Eylea  informed consent obtained and signed OD 0.30.24 - see procedure note  - Eylea  approved for 2025 -- injection is covered by insurance co pay - F/U 9-10 weeks -- DFE/OCT/possible injection  2,3. Lattice degeneration w/ atrophic holes, left eye - pigmented lattice at 1230 with retinal holes - s/p laser retinopexy OS (02.21.23) - good laser surrounding - monitor  4. PVD / vitreous syneresis OU - OD with stable release of VMA to full PVD -- symptomatic flashes and floaters improved  - OS - stable PVD  - Discussed findings and prognosis  - No RT or RD on 360 exam OU  - Reviewed s/s of RT/RD  - strict return precautions for any such RT/RD symptoms  - monitor  5. Age related macular degeneration, non-exudative, both eyes - The incidence, anatomy, and pathology of dry AMD, risk of progression, and the AREDS and AREDS 2 study including smoking risks discussed with patient.   - continue Amsler grid monitoring  - monitor  6. Pseudophakia OU  - s/p CE/IOL OD (04.18.24, Dr. Paulene Boron)  - IOL in good position, doing well  - monitor  7. Choroidal Nevus, OD -- stable  - pigmented lesion at 01:30, 1.5DD  - no visual symptoms, SRF or orange pigment  - +drusen  - thickness < 2mm  - discussed findings, prognosis  - recommend monitoring  Ophthalmic Meds Ordered this visit:  No orders of the defined types were  placed in this encounter.    Return for f/u 9-10 weeks, exu ARMD OD, DFE, OCT.  There are no Patient Instructions on file for this visit.   Explained the diagnoses, plan, and follow up with the patient and they expressed understanding.  Patient expressed understanding of the importance of proper follow up care.   This document serves as a record of services personally performed by Jeanice Millard, MD, PhD. It was created on  their behalf by Morley Arabia. Bevin Bucks, OA an ophthalmic technician. The creation of this record is the provider's dictation and/or activities during the visit.    Electronically signed by: Morley Arabia. Bevin Bucks, OA 09/06/23 4:10 PM   Jeanice Millard, M.D., Ph.D. Diseases & Surgery of the Retina and Vitreous [facility]    Abbreviations: M myopia (nearsighted); A astigmatism; H hyperopia (farsighted); P presbyopia; Mrx spectacle prescription;  CTL contact lenses; OD right eye; OS left eye; OU both eyes  XT exotropia; ET esotropia; PEK punctate epithelial keratitis; PEE punctate epithelial erosions; DES dry eye syndrome; MGD meibomian gland dysfunction; ATs artificial tears; PFAT's preservative free artificial tears; NSC nuclear sclerotic cataract; PSC posterior subcapsular cataract; ERM epi-retinal membrane; PVD posterior vitreous detachment; RD retinal detachment; DM diabetes mellitus; DR diabetic retinopathy; NPDR non-proliferative diabetic retinopathy; PDR proliferative diabetic retinopathy; CSME clinically significant macular edema; DME diabetic macular edema; dbh dot blot hemorrhages; CWS cotton wool spot; POAG primary open angle glaucoma; C/D cup-to-disc ratio; HVF humphrey visual field; GVF goldmann visual field; OCT optical coherence tomography; IOP intraocular pressure; BRVO Branch retinal vein occlusion; CRVO central retinal vein occlusion; CRAO central retinal artery occlusion; BRAO branch retinal artery occlusion; RT retinal tear; SB scleral buckle; PPV pars plana vitrectomy; VH Vitreous hemorrhage; PRP panretinal laser photocoagulation; IVK intravitreal kenalog; VMT vitreomacular traction; MH Macular hole;  NVD neovascularization of the disc; NVE neovascularization elsewhere; AREDS age related eye disease study; ARMD age related macular degeneration; POAG primary open angle glaucoma; EBMD epithelial/anterior basement membrane dystrophy; ACIOL anterior chamber intraocular lens; IOL  intraocular lens; PCIOL posterior chamber intraocular lens; Phaco/IOL phacoemulsification with intraocular lens placement; PRK photorefractive keratectomy; LASIK laser assisted in situ keratomileusis; HTN hypertension; DM diabetes mellitus; COPD chronic obstructive pulmonary disease

## 2023-09-14 ENCOUNTER — Encounter (INDEPENDENT_AMBULATORY_CARE_PROVIDER_SITE_OTHER): Admitting: Ophthalmology

## 2023-11-08 ENCOUNTER — Ambulatory Visit (INDEPENDENT_AMBULATORY_CARE_PROVIDER_SITE_OTHER): Admitting: Ophthalmology

## 2023-11-08 ENCOUNTER — Encounter (INDEPENDENT_AMBULATORY_CARE_PROVIDER_SITE_OTHER): Payer: Self-pay | Admitting: Ophthalmology

## 2023-11-08 DIAGNOSIS — Z961 Presence of intraocular lens: Secondary | ICD-10-CM

## 2023-11-08 DIAGNOSIS — H33322 Round hole, left eye: Secondary | ICD-10-CM

## 2023-11-08 DIAGNOSIS — H34831 Tributary (branch) retinal vein occlusion, right eye, with macular edema: Secondary | ICD-10-CM

## 2023-11-08 DIAGNOSIS — H353132 Nonexudative age-related macular degeneration, bilateral, intermediate dry stage: Secondary | ICD-10-CM

## 2023-11-08 DIAGNOSIS — H43813 Vitreous degeneration, bilateral: Secondary | ICD-10-CM

## 2023-11-08 DIAGNOSIS — D3131 Benign neoplasm of right choroid: Secondary | ICD-10-CM | POA: Diagnosis not present

## 2023-11-08 DIAGNOSIS — H35412 Lattice degeneration of retina, left eye: Secondary | ICD-10-CM

## 2023-11-08 MED ORDER — AFLIBERCEPT 2MG/0.05ML IZ SOLN FOR KALEIDOSCOPE
2.0000 mg | INTRAVITREAL | Status: AC | PRN
  Administered 2023-11-08: 2 mg via INTRAVITREAL

## 2023-11-08 NOTE — Progress Notes (Signed)
 [facility] - Clinic Note  11/08/2023     CHIEF COMPLAINT Patient presents for Retina Follow Up   HISTORY OF PRESENT ILLNESS: Sara Glover is a 68 y.o. female who presents to the clinic today for:   HPI     Retina Follow Up   Patient presents with  CRVO/BRVO.  In right eye.  This started 2.5 years ago.  Severity is moderate.  Duration of 10 weeks.  I, the attending physician,  performed the HPI with the patient and updated documentation appropriately.        Comments   Pt states the glasses she has had don't seem to line up well and she is constantly adjusting to see through the lenses. Pt denies FOL/new floaters/pain. Pt does not use ats.      Last edited by Valdemar Rogue, MD on 11/15/2023  2:59 AM.    Patient states vision is good, no spikes in BP or blood sugars. Pt reports working 7 days a week, stressful.   Referring physician: Mevelyn JONETTA Bathe, OD [address] [place],  [place] [address]  HISTORICAL INFORMATION:   Selected notes from the MEDICAL RECORD NUMBER Referred by Dr. Mevelyn for concern of BRVO LEE:  Ocular Hx- PMH-    CURRENT MEDICATIONS: No current outpatient medications on file. (Ophthalmic Drugs)   No current facility-administered medications for this visit. (Ophthalmic Drugs)   Current Outpatient Medications (Other)  Medication Sig   lisinopril -hydrochlorothiazide (ZESTORETIC) 10-12.5 MG tablet Take 0.5 tablets by mouth daily.   No current facility-administered medications for this visit. (Other)   REVIEW OF SYSTEMS: ROS   Positive for: Cardiovascular, Eyes, Respiratory Negative for: Constitutional, Gastrointestinal, Neurological, Skin, Genitourinary, Musculoskeletal, HENT, Endocrine, Psychiatric, Allergic/Imm, Heme/Lymph Last edited by Elnor Avelina RAMAN, COT on 11/08/2023  2:58 PM.         ALLERGIES Allergies  Allergen Reactions   Metformin     REACTION: diarrhea   PAST MEDICAL HISTORY Past Medical  History:  Diagnosis Date   Diabetes mellitus 2004   type II   Hyperlipidemia 2004   Hypertension 2004   Morbid obesity (HCC)    Sleep apnea    Past Surgical History:  Procedure Laterality Date   bariactric surgery     CATARACT EXTRACTION     CESAREAN SECTION     CHOLECYSTECTOMY     FAMILY HISTORY Family History  Problem Relation Age of Onset   Diabetes Mother    Cancer Mother        colon CA   Schizophrenia Mother    Diabetes Father    Cancer Sister        ovarian CA in 7s   Diabetes Brother    SOCIAL HISTORY Social History   Tobacco Use   Smoking status: Never   Smokeless tobacco: Never  Vaping Use   Vaping status: Never Used  Substance Use Topics   Alcohol use: No   Drug use: No       OPHTHALMIC EXAM:  Base Eye Exam     Visual Acuity (Snellen - Linear)       Right Left   Dist cc 20/20 -1 20/25 -2   Dist ph cc NI 20/20 -2    Correction: Glasses         Tonometry (Tonopen, 3:10 PM)       Right Left   Pressure 12 11         Pupils       Pupils  Right PERRL   Left PERRL         Neuro/Psych     Oriented x3: Yes   Mood/Affect: Normal         Dilation     Both eyes: 1.0% Mydriacyl, 2.5% Phenylephrine @ 3:10 PM           Slit Lamp and Fundus Exam     Slit Lamp Exam       Right Left   Lids/Lashes Dermatochalasis - upper lid, mild MGD Dermatochalasis - upper lid, mild MGD   Conjunctiva/Sclera White and quiet Mild temporal pinguecula   Cornea trace PEE, Arcus, Well healed  cataract wound, Debris in tear film Mild tear film debris, well healed cataract wound   Anterior Chamber Deep and clear Deep and quiet   Iris Round and dilated Round and dilated   Lens PC IOL in good postition PC IOL good position   Anterior Vitreous Mild Vitreous syneresis, Posterior vitreous detachment, Vitreous condensations Mild Vitreous syneresis, Posterior vitreous detachment, vitreous condensations         Fundus Exam       Right Left    Disc mild pallor, Sharp rim mild Pallor, Sharp rim, PPA/PPP   C/D Ratio 0.2 0.3   Macula good foveal reflex, drusen, persistent edema/cystic changes IN mac and fovea, no heme Flat, good foveal reflex, RPE mottling and clumping, No heme or edema   Vessels attenuated, Tortuous, IT BRVO attenuated, Tortuous   Periphery Attached, pigmented choroidal lesion with overlying drusen at 130, relatively flat, 1.5DD, No heme, No RT/RD Attached, pigmented lattice at 0100 with retinal holes - good laser surrounding, no new RT/RD/Lattice           Refraction     Wearing Rx       Sphere Cylinder Axis Add   Right -1.75 +0.75 008 +1.75   Left -2.25 +0.50 137 +1.75           IMAGING AND PROCEDURES  Imaging and Procedures for 11/08/2023  OCT, Retina - OU - Both Eyes       Right Eye Quality was good. Central Foveal Thickness: 235. Progression has been stable. Findings include normal foveal contour, no IRF, no SRF, retinal drusen , intraretinal hyper-reflective material (Persistent cystic changes/edema IN fovea and macula--minimal improvement, stable improvement in foveal contour).   Left Eye Quality was good. Central Foveal Thickness: 251. Progression has been stable. Findings include normal foveal contour, no IRF, no SRF, myopic contour, retinal drusen .   Notes *Images captured and stored on drive  Diagnosis / Impression:  OD: inferior BRVO with CME -- Persistent cystic changes/edema IN fovea and macula--minimal improvement, stable improvement in foveal contour OS: non-exu ARMD   Clinical management:  See below  Abbreviations: NFP - Normal foveal profile. CME - cystoid macular edema. PED - pigment epithelial detachment. IRF - intraretinal fluid. SRF - subretinal fluid. EZ - ellipsoid zone. ERM - epiretinal membrane. ORA - outer retinal atrophy. ORT - outer retinal tubulation. SRHM - subretinal hyper-reflective material. IRHM - intraretinal hyper-reflective material      Intravitreal  Injection, Pharmacologic Agent - OD - Right Eye       Time Out 11/08/2023. 3:48 PM. Confirmed correct patient, procedure, site, and patient consented.   Anesthesia Topical anesthesia was used. Anesthetic medications included Lidocaine 2%, Proparacaine 0.5%.   Procedure Preparation included 5% betadine to ocular surface, eyelid speculum. A (32g) needle was used.   Injection: 2 mg aflibercept  2 MG/0.05ML   Route:  Intravitreal, Site: Right Eye   NDC: [id number], Lot: [id number], Expiration date: 01/18/2025, Waste: 0 mL   Post-op Post injection exam found visual acuity of at least counting fingers. The patient tolerated the procedure well. There were no complications. The patient received written and verbal post procedure care education. Post injection medications were not given.            ASSESSMENT/PLAN:    ICD-10-CM   1. Branch retinal vein occlusion of right eye with macular edema  H34.8310 OCT, Retina - OU - Both Eyes    Intravitreal Injection, Pharmacologic Agent - OD - Right Eye    aflibercept  (EYLEA ) SOLN 2 mg    2. Lattice degeneration of left retina  H35.412     3. Retinal hole of left eye  H33.322     4. Posterior vitreous detachment of both eyes  H43.813     5. Intermediate stage nonexudative age-related macular degeneration of both eyes  H35.3132     6. 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 BRVO w/ CME OD  - delayed to follow up from 10 weeks to 12 weeks (03.26.25 - 06.17.25) - s/p IVA OD #1 (02.14.23), #2 (03.14.23), #3 (04.11.23), #4 (05.26.23), #5 (06.23.23), #6 (07.28.23), #7 (09.11.23), #8 (11.14.23), #9 (12.29.23), #10 (02.12.24), #11 (04.02.24), #12 (05.28.24), #13 (07.08.24), #14 (08.19.24) -- IVA resistance - s/p IVE OD #1 (09.30.24), #2 (11.11.24), #3 (12.16.24), #4 (01.27.25), #5 (03.26.25), #6 (06.17.25) **hx of increased IRF at 12 weeks on 06.17.25 exam (IVE)** - BCVA OD 20/20 improved from 20/25   - OCT shows OD persistent cystic changes/edema IN fovea and macula--minimal improvement, stable improvement in foveal contour at 9 wks **discussed decreased efficacy / resistance to Eylea  and potential benefit of switching medication** - recommend IVE OD #7 today, 08.19.25 w/ f/u back to 8 wks - pt wishes to proceed with injection - RBA of procedure discussed, questions answered - Eylea  informed consent obtained and signed OD 0.30.24 - see procedure note  - Eylea  approved for 2025 -- injection is covered by insurance co pay - F/U 8 weeks -- DFE/OCT/possible injection **will check [employer] auth**  2,3. Lattice degeneration w/ atrophic holes, left eye - pigmented lattice at 1230 with retinal holes - s/p laser retinopexy OS (02.21.23) - good laser surrounding - monitor  4. PVD / vitreous syneresis OU - OD with stable release of VMA to full PVD -- symptomatic flashes and floaters improved  - OS - stable PVD  - Discussed findings and prognosis  - No RT or RD on 360 exam OU  - Reviewed s/s of RT/RD  - strict return precautions for any such RT/RD symptoms  - monitor  5. Age related macular degeneration, non-exudative, both eyes - The incidence, anatomy, and pathology of dry AMD, risk of progression, and the AREDS and AREDS 2 study including smoking risks discussed with patient.   - continue Amsler grid monitoring  - monitor  6. Pseudophakia OU  - s/p CE/IOL OD (04.18.24, Dr. Austin)  - IOL in good position, doing well  - monitor  7. Choroidal Nevus, OD -- stable  - pigmented lesion at 01:30, 1.5DD  - no visual symptoms, SRF or orange pigment  - +drusen  - thickness < 2mm  - discussed findings, prognosis  - recommend monitoring  Ophthalmic Meds Ordered this visit:  Meds ordered this encounter  Medications   aflibercept  (EYLEA )  SOLN 2 mg     Return in about 8 weeks (around 01/03/2024) for ex ARMD OD, Dilated Exam, OCT, Possible Injxn.  There are no Patient Instructions on file  for this visit.   Explained the diagnoses, plan, and follow up with the patient and they expressed understanding.  Patient expressed understanding of the importance of proper follow up care.   This document serves as a record of services personally performed by Redell JUDITHANN Hans, MD, PhD. It was created on their behalf by Auston Muzzy, COMT. The creation of this record is the provider's dictation and/or activities during the visit.  Electronically signed by: Auston Muzzy, COMT 11/15/23 2:59 AM  Redell JUDITHANN Hans, M.D., Ph.D. Diseases & Surgery of the Retina and Vitreous [facility]  I have reviewed the above documentation for accuracy and completeness, and I agree with the above. Redell JUDITHANN Hans, M.D., Ph.D. 11/15/23 3:01 AM   Abbreviations: M myopia (nearsighted); A astigmatism; H hyperopia (farsighted); P presbyopia; Mrx spectacle prescription;  CTL contact lenses; OD right eye; OS left eye; OU both eyes  XT exotropia; ET esotropia; PEK punctate epithelial keratitis; PEE punctate epithelial erosions; DES dry eye syndrome; MGD meibomian gland dysfunction; ATs artificial tears; PFAT's preservative free artificial tears; NSC nuclear sclerotic cataract; PSC posterior subcapsular cataract; ERM epi-retinal membrane; PVD posterior vitreous detachment; RD retinal detachment; DM diabetes mellitus; DR diabetic retinopathy; NPDR non-proliferative diabetic retinopathy; PDR proliferative diabetic retinopathy; CSME clinically significant macular edema; DME diabetic macular edema; dbh dot blot hemorrhages; CWS cotton wool spot; POAG primary open angle glaucoma; C/D cup-to-disc ratio; HVF humphrey visual field; GVF goldmann visual field; OCT optical coherence tomography; IOP intraocular pressure; BRVO Branch retinal vein occlusion; CRVO central retinal vein occlusion; CRAO central retinal artery occlusion; BRAO branch retinal artery occlusion; RT retinal tear; SB scleral buckle; PPV pars plana  vitrectomy; VH Vitreous hemorrhage; PRP panretinal laser photocoagulation; IVK intravitreal kenalog; VMT vitreomacular traction; MH Macular hole;  NVD neovascularization of the disc; NVE neovascularization elsewhere; AREDS age related eye disease study; ARMD age related macular degeneration; POAG primary open angle glaucoma; EBMD epithelial/anterior basement membrane dystrophy; ACIOL anterior chamber intraocular lens; IOL intraocular lens; PCIOL posterior chamber intraocular lens; Phaco/IOL phacoemulsification with intraocular lens placement; PRK photorefractive keratectomy; LASIK laser assisted in situ keratomileusis; HTN hypertension; DM diabetes mellitus; COPD chronic obstructive pulmonary disease

## 2023-12-20 NOTE — Progress Notes (Signed)
 [facility] - Clinic Note  01/03/2024     CHIEF COMPLAINT Patient presents for Retina Follow Up   HISTORY OF PRESENT ILLNESS: Sara Glover is a 68 y.o. female who presents to the clinic today for:   HPI     Retina Follow Up   Patient presents with  CRVO/BRVO.  In right eye.  This started 2.5 years ago.  Severity is moderate.  Duration of 10 weeks.  I, the attending physician,  performed the HPI with the patient and updated documentation appropriately.        Comments   Pt states the glasses she has had don't seem to line up well and she is constantly adjusting to see through the lenses. Pt denies FOL/new floaters/pain. Pt does not use ats. Pt does not monitor amsler grid or take areds.      Last edited by Valdemar Rogue, MD on 01/03/2024  6:23 PM.     Patient states   Referring physician: Mevelyn JONETTA Bathe, OD [address] [place],  [place] [address]  HISTORICAL INFORMATION:   Selected notes from the MEDICAL RECORD NUMBER Referred by Dr. Mevelyn for concern of BRVO LEE:  Ocular Hx- PMH-    CURRENT MEDICATIONS: No current outpatient medications on file. (Ophthalmic Drugs)   No current facility-administered medications for this visit. (Ophthalmic Drugs)   Current Outpatient Medications (Other)  Medication Sig   lisinopril -hydrochlorothiazide (ZESTORETIC) 10-12.5 MG tablet Take 0.5 tablets by mouth daily.   No current facility-administered medications for this visit. (Other)   REVIEW OF SYSTEMS: ROS   Positive for: Cardiovascular, Eyes, Respiratory Negative for: Constitutional, Gastrointestinal, Neurological, Skin, Genitourinary, Musculoskeletal, HENT, Endocrine, Psychiatric, Allergic/Imm, Heme/Lymph Last edited by Elnor Avelina RAMAN, COT on 01/03/2024  2:53 PM.     ALLERGIES Allergies  Allergen Reactions   Metformin     REACTION: diarrhea   PAST MEDICAL HISTORY Past Medical History:  Diagnosis Date   Diabetes mellitus 2004    type II   Hyperlipidemia 2004   Hypertension 2004   Morbid obesity (HCC)    Sleep apnea    Past Surgical History:  Procedure Laterality Date   bariactric surgery     CATARACT EXTRACTION     CESAREAN SECTION     CHOLECYSTECTOMY     FAMILY HISTORY Family History  Problem Relation Age of Onset   Diabetes Mother    Cancer Mother        colon CA   Schizophrenia Mother    Diabetes Father    Cancer Sister        ovarian CA in 44s   Diabetes Brother    SOCIAL HISTORY Social History   Tobacco Use   Smoking status: Never   Smokeless tobacco: Never  Vaping Use   Vaping status: Never Used  Substance Use Topics   Alcohol use: No   Drug use: No       OPHTHALMIC EXAM:  Base Eye Exam     Visual Acuity (Snellen - Linear)       Right Left   Dist cc 20/20 20/25 -2   Dist ph cc NI 20/20 -1    Correction: Glasses         Tonometry (Tonopen, 2:59 PM)       Right Left   Pressure 16 13         Pupils       Pupils Dark Light Shape React APD   Right PERRL 4 2 Round Brisk  None   Left PERRL 4 2 Round Brisk None         Visual Fields       Left Right    Full Full         Extraocular Movement       Right Left    Full, Ortho Full, Ortho         Neuro/Psych     Oriented x3: Yes   Mood/Affect: Normal         Dilation     Both eyes: 1.0% Mydriacyl, 2.5% Phenylephrine @ 2:59 PM           Slit Lamp and Fundus Exam     Slit Lamp Exam       Right Left   Lids/Lashes Dermatochalasis - upper lid, mild MGD Dermatochalasis - upper lid, mild MGD   Conjunctiva/Sclera White and quiet Mild temporal pinguecula   Cornea trace PEE, Arcus, Well healed  cataract wound, Debris in tear film Mild tear film debris, well healed cataract wound   Anterior Chamber Deep and clear Deep and quiet   Iris Round and dilated Round and dilated   Lens PC IOL in good postition PC IOL good position   Anterior Vitreous Mild Vitreous syneresis, Posterior vitreous detachment,  Vitreous condensations Mild Vitreous syneresis, Posterior vitreous detachment, vitreous condensations         Fundus Exam       Right Left   Disc mild pallor, Sharp rim mild Pallor, Sharp rim, PPA/PPP   C/D Ratio 0.2 0.3   Macula good foveal reflex, drusen, persistent edema/cystic changes IN mac and fovea-slightly improved, no heme Flat, good foveal reflex, RPE mottling and clumping, No heme or edema   Vessels attenuated, Tortuous, IT BRVO attenuated, Tortuous   Periphery Attached, pigmented choroidal lesion with overlying drusen at 130, relatively flat, 1.5DD, No heme, No RT/RD Attached, pigmented lattice at 0100 with retinal holes - good laser surrounding, no new RT/RD/Lattice           Refraction     Wearing Rx       Sphere Cylinder Axis Add   Right -1.75 +0.75 008 +1.75   Left -2.25 +0.50 137 +1.75           IMAGING AND PROCEDURES  Imaging and Procedures for 01/03/2024  OCT, Retina - OU - Both Eyes       Right Eye Quality was good. Central Foveal Thickness: 231. Progression has improved. Findings include normal foveal contour, no SRF, retinal drusen , intraretinal hyper-reflective material, intraretinal fluid (Persistent cystic changes/edema IN fovea and macula--improved, stable improvement in foveal contour).   Left Eye Quality was good. Central Foveal Thickness: 252. Progression has been stable. Findings include normal foveal contour, no IRF, no SRF, myopic contour, retinal drusen .   Notes *Images captured and stored on drive  Diagnosis / Impression:  OD: inferior BRVO with CME -- Persistent cystic changes/edema IN fovea and macula--improved, stable improvement in foveal contour OS: non-exu ARMD   Clinical management:  See below  Abbreviations: NFP - Normal foveal profile. CME - cystoid macular edema. PED - pigment epithelial detachment. IRF - intraretinal fluid. SRF - subretinal fluid. EZ - ellipsoid zone. ERM - epiretinal membrane. ORA - outer retinal  atrophy. ORT - outer retinal tubulation. SRHM - subretinal hyper-reflective material. IRHM - intraretinal hyper-reflective material      Intravitreal Injection, Pharmacologic Agent - OD - Right Eye       Time Out 01/03/2024. 3:25 PM.  Confirmed correct patient, procedure, site, and patient consented.   Anesthesia Topical anesthesia was used. Anesthetic medications included Lidocaine 2%, Proparacaine 0.5%.   Procedure Preparation included 5% betadine to ocular surface, eyelid speculum. A supplied (32g) needle was used.   Injection: 6 mg faricimab-svoa 6 MG/0.05ML   Route: Intravitreal, Site: Right Eye   NDC: 49757-903-93, Lot: [id number], Expiration date: 12/19/2024, Waste: 0 mL   Post-op Post injection exam found visual acuity of at least counting fingers. The patient tolerated the procedure well. There were no complications. The patient received written and verbal post procedure care education. Post injection medications were not given.            ASSESSMENT/PLAN:    ICD-10-CM   1. Branch retinal vein occlusion of right eye with macular edema (HCC)  H34.8310 OCT, Retina - OU - Both Eyes    Intravitreal Injection, Pharmacologic Agent - OD - Right Eye    faricimab-svoa (VABYSMO) 6mg /0.83mL intravitreal injection    2. Lattice degeneration of left retina  H35.412     3. Retinal hole of left eye  H33.322     4. Posterior vitreous detachment of both eyes  H43.813     5. Intermediate stage nonexudative age-related macular degeneration of both eyes  H35.3132     6. 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 BRVO w/ CME OD  - delayed to follow up from 10 weeks to 12 weeks (03.26.25 - 06.17.25) - s/p IVA OD #1 (02.14.23), #2 (03.14.23), #3 (04.11.23), #4 (05.26.23), #5 (06.23.23), #6 (07.28.23), #7 (09.11.23), #8 (11.14.23), #9 (12.29.23), #10 (02.12.24), #11 (04.02.24), #12 (05.28.24), #13 (07.08.24), #14 (08.19.24) -- IVA  resistance - s/p IVE OD #1 (09.30.24), #2 (11.11.24), #3 (12.16.24), #4 (01.27.25), #5 (03.26.25), #6 (06.17.25), #7 (08.19.25) **hx of increased IRF at 12 weeks on 06.17.25 exam (IVE)** - BCVA OD 20/20 stable  - OCT shows OD persistent cystic changes/edema IN fovea and macula--improved, stable improvement in foveal contour at 8 wks - recommend IVV OD #1 today, 10.14.25 w/ f/u in 8 wks - pt wishes to proceed with injection - RBA of procedure discussed, questions answered - Eylea  informed consent obtained and signed OD 10.30.24 - Vabysmo informed consent obtained and signed OD 10.14.25 - see procedure note  - Eylea  and Vabysmo both approved for 2025 -- injection is covered by insurance co pay - F/U 8 weeks -- DFE/OCT/possible injection  2,3. Lattice degeneration w/ atrophic holes, left eye - pigmented lattice at 1230 with retinal holes - s/p laser retinopexy OS (02.21.23) - good laser surrounding - monitor  4. PVD / vitreous syneresis OU - OD with stable release of VMA to full PVD -- symptomatic flashes and floaters improved  - OS - stable PVD  - Discussed findings and prognosis  - No RT or RD on 360 exam OU  - Reviewed s/s of RT/RD  - strict return precautions for any such RT/RD symptoms  - monitor  5. Age related macular degeneration, non-exudative, both eyes - The incidence, anatomy, and pathology of dry AMD, risk of progression, and the AREDS and AREDS 2 study including smoking risks discussed with patient.   - continue Amsler grid monitoring  - monitor  6. Pseudophakia OU  - s/p CE/IOL OD (04.18.24, Dr. Austin)  - IOL in good position, doing well  - monitor  7. Choroidal Nevus, OD -- stable  - pigmented lesion  at 01:30, 1.5DD  - no visual symptoms, SRF or orange pigment  - +drusen  - thickness < 2mm  - discussed findings, prognosis  - recommend monitoring  Ophthalmic Meds Ordered this visit:  Meds ordered this encounter  Medications   faricimab-svoa (VABYSMO)  6mg /0.25mL intravitreal injection     Return in about 8 weeks (around 02/28/2024) for BRVO OD, DFE, OCT, Possible Injxn.  There are no Patient Instructions on file for this visit.   Explained the diagnoses, plan, and follow up with the patient and they expressed understanding.  Patient expressed understanding of the importance of proper follow up care.   This document serves as a record of services personally performed by Redell JUDITHANN Hans, MD, PhD. It was created on their behalf by Wanda GEANNIE Keens, COT an ophthalmic technician. The creation of this record is the provider's dictation and/or activities during the visit.    Electronically signed by:  Wanda GEANNIE Keens, COT  01/08/24 4:02 PM  This document serves as a record of services personally performed by Redell JUDITHANN Hans, MD, PhD. It was created on their behalf by Almetta Pesa, an ophthalmic technician. The creation of this record is the provider's dictation and/or activities during the visit.    Electronically signed by: Almetta Pesa, OA, 01/08/24  4:02 PM  Redell JUDITHANN Hans, M.D., Ph.D. Diseases & Surgery of the Retina and Vitreous [facility]  I have reviewed the above documentation for accuracy and completeness, and I agree with the above. Redell JUDITHANN Hans, M.D., Ph.D. 01/08/24 4:04 PM   Abbreviations: M myopia (nearsighted); A astigmatism; H hyperopia (farsighted); P presbyopia; Mrx spectacle prescription;  CTL contact lenses; OD right eye; OS left eye; OU both eyes  XT exotropia; ET esotropia; PEK punctate epithelial keratitis; PEE punctate epithelial erosions; DES dry eye syndrome; MGD meibomian gland dysfunction; ATs artificial tears; PFAT's preservative free artificial tears; NSC nuclear sclerotic cataract; PSC posterior subcapsular cataract; ERM epi-retinal membrane; PVD posterior vitreous detachment; RD retinal detachment; DM diabetes mellitus; DR diabetic retinopathy; NPDR non-proliferative  diabetic retinopathy; PDR proliferative diabetic retinopathy; CSME clinically significant macular edema; DME diabetic macular edema; dbh dot blot hemorrhages; CWS cotton wool spot; POAG primary open angle glaucoma; C/D cup-to-disc ratio; HVF humphrey visual field; GVF goldmann visual field; OCT optical coherence tomography; IOP intraocular pressure; BRVO Branch retinal vein occlusion; CRVO central retinal vein occlusion; CRAO central retinal artery occlusion; BRAO branch retinal artery occlusion; RT retinal tear; SB scleral buckle; PPV pars plana vitrectomy; VH Vitreous hemorrhage; PRP panretinal laser photocoagulation; IVK intravitreal kenalog; VMT vitreomacular traction; MH Macular hole;  NVD neovascularization of the disc; NVE neovascularization elsewhere; AREDS age related eye disease study; ARMD age related macular degeneration; POAG primary open angle glaucoma; EBMD epithelial/anterior basement membrane dystrophy; ACIOL anterior chamber intraocular lens; IOL intraocular lens; PCIOL posterior chamber intraocular lens; Phaco/IOL phacoemulsification with intraocular lens placement; PRK photorefractive keratectomy; LASIK laser assisted in situ keratomileusis; HTN hypertension; DM diabetes mellitus; COPD chronic obstructive pulmonary disease

## 2024-01-03 ENCOUNTER — Encounter (INDEPENDENT_AMBULATORY_CARE_PROVIDER_SITE_OTHER): Payer: Self-pay | Admitting: Ophthalmology

## 2024-01-03 ENCOUNTER — Ambulatory Visit (INDEPENDENT_AMBULATORY_CARE_PROVIDER_SITE_OTHER): Admitting: Ophthalmology

## 2024-01-03 DIAGNOSIS — H33322 Round hole, left eye: Secondary | ICD-10-CM

## 2024-01-03 DIAGNOSIS — Z961 Presence of intraocular lens: Secondary | ICD-10-CM

## 2024-01-03 DIAGNOSIS — D3131 Benign neoplasm of right choroid: Secondary | ICD-10-CM

## 2024-01-03 DIAGNOSIS — H34831 Tributary (branch) retinal vein occlusion, right eye, with macular edema: Secondary | ICD-10-CM

## 2024-01-03 DIAGNOSIS — H353132 Nonexudative age-related macular degeneration, bilateral, intermediate dry stage: Secondary | ICD-10-CM

## 2024-01-03 DIAGNOSIS — H43813 Vitreous degeneration, bilateral: Secondary | ICD-10-CM | POA: Diagnosis not present

## 2024-01-03 DIAGNOSIS — H35412 Lattice degeneration of retina, left eye: Secondary | ICD-10-CM

## 2024-01-03 MED ORDER — FARICIMAB-SVOA 6 MG/0.05ML IZ SOSY
6.0000 mg | PREFILLED_SYRINGE | INTRAVITREAL | Status: AC | PRN
  Administered 2024-01-03: 6 mg via INTRAVITREAL

## 2024-02-08 ENCOUNTER — Other Ambulatory Visit: Payer: Self-pay | Admitting: Nurse Practitioner

## 2024-02-08 DIAGNOSIS — R1032 Left lower quadrant pain: Secondary | ICD-10-CM

## 2024-02-20 ENCOUNTER — Ambulatory Visit
Admission: RE | Admit: 2024-02-20 | Discharge: 2024-02-20 | Disposition: A | Discharge status: Home / Self Care | Source: Ambulatory Visit | Attending: Nurse Practitioner | Admitting: Nurse Practitioner

## 2024-02-20 DIAGNOSIS — R1032 Left lower quadrant pain: Secondary | ICD-10-CM | POA: Diagnosis present

## 2024-02-27 NOTE — Progress Notes (Signed)
 [facility] - Clinic Note  02/29/2024     CHIEF COMPLAINT Patient presents for No chief complaint on file.   HISTORY OF PRESENT ILLNESS: Sara Glover is a 68 y.o. female who presents to the clinic today for:     Patient states   Referring physician: Mevelyn JONETTA Bathe, OD [address] [place],  [place] [address]  HISTORICAL INFORMATION:   Selected notes from the MEDICAL RECORD NUMBER Referred by Dr. Mevelyn for concern of BRVO LEE:  Ocular Hx- PMH-    CURRENT MEDICATIONS: No current outpatient medications on file. (Ophthalmic Drugs)   No current facility-administered medications for this visit. (Ophthalmic Drugs)   Current Outpatient Medications (Other)  Medication Sig   lisinopril -hydrochlorothiazide (ZESTORETIC) 10-12.5 MG tablet Take 0.5 tablets by mouth daily.   No current facility-administered medications for this visit. (Other)   REVIEW OF SYSTEMS:   ALLERGIES Allergies  Allergen Reactions   Metformin     REACTION: diarrhea   PAST MEDICAL HISTORY Past Medical History:  Diagnosis Date   Diabetes mellitus 2004   type II   Hyperlipidemia 2004   Hypertension 2004   Morbid obesity (HCC)    Sleep apnea    Past Surgical History:  Procedure Laterality Date   bariactric surgery     CATARACT EXTRACTION     CESAREAN SECTION     CHOLECYSTECTOMY     FAMILY HISTORY Family History  Problem Relation Age of Onset   Diabetes Mother    Cancer Mother        colon CA   Schizophrenia Mother    Diabetes Father    Cancer Sister        ovarian CA in 75s   Diabetes Brother    SOCIAL HISTORY Social History   Tobacco Use   Smoking status: Never   Smokeless tobacco: Never  Vaping Use   Vaping status: Never Used  Substance Use Topics   Alcohol use: No   Drug use: No       OPHTHALMIC EXAM:  Not recorded    IMAGING AND PROCEDURES  Imaging and Procedures for 02/29/2024          ASSESSMENT/PLAN:  No diagnosis  found.   1. BRVO w/ CME OD  - delayed to follow up from 10 weeks to 12 weeks (03.26.25 - 06.17.25) - s/p IVA OD #1 (02.14.23), #2 (03.14.23), #3 (04.11.23), #4 (05.26.23), #5 (06.23.23), #6 (07.28.23), #7 (09.11.23), #8 (11.14.23), #9 (12.29.23), #10 (02.12.24), #11 (04.02.24), #12 (05.28.24), #13 (07.08.24), #14 (08.19.24) -- IVA resistance - s/p IVE OD #1 (09.30.24), #2 (11.11.24), #3 (12.16.24), #4 (01.27.25), #5 (03.26.25), #6 (06.17.25), #7 (08.19.25) - s/p IVV OD #1 (10.14.25) **hx of increased IRF at 12 weeks on 06.17.25 exam (IVE)** - BCVA OD 20/20 stable  - OCT shows OD persistent cystic changes/edema IN fovea and macula--improved, stable improvement in foveal contour at 8 wks - recommend IVV OD #2 today, 12.10.25 w/ f/u in 8 wks - pt wishes to proceed with injection - RBA of procedure discussed, questions answered - Eylea  informed consent obtained and signed OD 10.30.24 - Vabysmo  informed consent obtained and signed OD 10.14.25 - see procedure note  - Eylea  and Vabysmo  both approved for 2025 -- injection is covered by insurance co pay - F/U 8 weeks -- DFE/OCT/possible injection  2,3. Lattice degeneration w/ atrophic holes, left eye - pigmented lattice at 1230 with retinal holes - s/p laser retinopexy OS (02.21.23) - good laser surrounding - monitor  4. PVD / vitreous syneresis OU - OD with stable release of VMA to full PVD -- symptomatic flashes and floaters improved  - OS - stable PVD  - Discussed findings and prognosis  - No RT or RD on 360 exam OU  - Reviewed s/s of RT/RD  - strict return precautions for any such RT/RD symptoms  - monitor  5. Age related macular degeneration, non-exudative, both eyes - The incidence, anatomy, and pathology of dry AMD, risk of progression, and the AREDS and AREDS 2 study including smoking risks discussed with patient.   - continue Amsler grid monitoring  - monitor  6. Pseudophakia OU  - s/p CE/IOL OD (04.18.24, Dr. Austin)  - IOL in  good position, doing well  - monitor  7. 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    No follow-ups on file.  There are no Patient Instructions on file for this visit.   Explained the diagnoses, plan, and follow up with the patient and they expressed understanding.  Patient expressed understanding of the importance of proper follow up care.    This document serves as a record of services personally performed by Redell JUDITHANN Hans, MD, PhD. It was created on their behalf by Almetta Pesa, an ophthalmic technician. The creation of this record is the provider's dictation and/or activities during the visit.    Electronically signed by: Almetta Pesa, OA, 02/27/24  1:06 PM  Redell JUDITHANN Hans, M.D., Ph.D. Diseases & Surgery of the Retina and Vitreous [facility]   Abbreviations: M myopia (nearsighted); A astigmatism; H hyperopia (farsighted); P presbyopia; Mrx spectacle prescription;  CTL contact lenses; OD right eye; OS left eye; OU both eyes  XT exotropia; ET esotropia; PEK punctate epithelial keratitis; PEE punctate epithelial erosions; DES dry eye syndrome; MGD meibomian gland dysfunction; ATs artificial tears; PFAT's preservative free artificial tears; NSC nuclear sclerotic cataract; PSC posterior subcapsular cataract; ERM epi-retinal membrane; PVD posterior vitreous detachment; RD retinal detachment; DM diabetes mellitus; DR diabetic retinopathy; NPDR non-proliferative diabetic retinopathy; PDR proliferative diabetic retinopathy; CSME clinically significant macular edema; DME diabetic macular edema; dbh dot blot hemorrhages; CWS cotton wool spot; POAG primary open angle glaucoma; C/D cup-to-disc ratio; HVF  humphrey visual field; GVF goldmann visual field; OCT optical coherence tomography; IOP intraocular pressure; BRVO Branch retinal vein occlusion; CRVO central retinal vein occlusion; CRAO central retinal artery occlusion; BRAO branch retinal artery occlusion; RT retinal tear; SB scleral buckle; PPV pars plana vitrectomy; VH Vitreous hemorrhage; PRP panretinal laser photocoagulation; IVK intravitreal kenalog; VMT vitreomacular traction; MH Macular hole;  NVD neovascularization of the disc; NVE neovascularization elsewhere; AREDS age related eye disease study; ARMD age related macular degeneration; POAG primary open angle glaucoma; EBMD epithelial/anterior basement membrane dystrophy; ACIOL anterior chamber intraocular lens; IOL intraocular lens; PCIOL posterior chamber intraocular lens; Phaco/IOL phacoemulsification with intraocular lens placement; PRK photorefractive keratectomy; LASIK laser assisted in situ keratomileusis; HTN hypertension; DM diabetes mellitus; COPD chronic obstructive pulmonary disease

## 2024-02-29 ENCOUNTER — Encounter (INDEPENDENT_AMBULATORY_CARE_PROVIDER_SITE_OTHER): Payer: Self-pay | Admitting: Ophthalmology

## 2024-02-29 ENCOUNTER — Ambulatory Visit (INDEPENDENT_AMBULATORY_CARE_PROVIDER_SITE_OTHER): Admitting: Ophthalmology

## 2024-02-29 DIAGNOSIS — H33322 Round hole, left eye: Secondary | ICD-10-CM

## 2024-02-29 DIAGNOSIS — D3131 Benign neoplasm of right choroid: Secondary | ICD-10-CM | POA: Diagnosis not present

## 2024-02-29 DIAGNOSIS — H35412 Lattice degeneration of retina, left eye: Secondary | ICD-10-CM

## 2024-02-29 DIAGNOSIS — H43813 Vitreous degeneration, bilateral: Secondary | ICD-10-CM | POA: Diagnosis not present

## 2024-02-29 DIAGNOSIS — H34831 Tributary (branch) retinal vein occlusion, right eye, with macular edema: Secondary | ICD-10-CM | POA: Diagnosis not present

## 2024-02-29 DIAGNOSIS — H353132 Nonexudative age-related macular degeneration, bilateral, intermediate dry stage: Secondary | ICD-10-CM

## 2024-02-29 DIAGNOSIS — Z961 Presence of intraocular lens: Secondary | ICD-10-CM

## 2024-02-29 MED ORDER — FARICIMAB-SVOA 6 MG/0.05ML IZ SOSY
6.0000 mg | PREFILLED_SYRINGE | INTRAVITREAL | Status: AC | PRN
  Administered 2024-02-29: 6 mg via INTRAVITREAL

## 2024-04-17 NOTE — Progress Notes (Shared)
 " [facility] - Clinic Note  05/01/2024     CHIEF COMPLAINT Patient presents for No chief complaint on file.   HISTORY OF PRESENT ILLNESS: Sara Glover is a 69 y.o. female who presents to the clinic today for:     Patient states the vision is doing well.   Referring physician: Mevelyn JONETTA Bathe, OD [address] [place],  [place] [address]  HISTORICAL INFORMATION:   Selected notes from the MEDICAL RECORD NUMBER Referred by Dr. Mevelyn for concern of BRVO LEE:  Ocular Hx- PMH-    CURRENT MEDICATIONS: No current outpatient medications on file. (Ophthalmic Drugs)   No current facility-administered medications for this visit. (Ophthalmic Drugs)   Current Outpatient Medications (Other)  Medication Sig   lisinopril -hydrochlorothiazide (ZESTORETIC) 10-12.5 MG tablet Take 0.5 tablets by mouth daily.   No current facility-administered medications for this visit. (Other)   REVIEW OF SYSTEMS:   ALLERGIES Allergies  Allergen Reactions   Metformin     REACTION: diarrhea   PAST MEDICAL HISTORY Past Medical History:  Diagnosis Date   Diabetes mellitus 2004   type II   Hyperlipidemia 2004   Hypertension 2004   Morbid obesity (HCC)    Sleep apnea    Past Surgical History:  Procedure Laterality Date   bariactric surgery     CATARACT EXTRACTION     CESAREAN SECTION     CHOLECYSTECTOMY     FAMILY HISTORY Family History  Problem Relation Age of Onset   Diabetes Mother    Cancer Mother        colon CA   Schizophrenia Mother    Diabetes Father    Cancer Sister        ovarian CA in 9s   Diabetes Brother    SOCIAL HISTORY Social History   Tobacco Use   Smoking status: Never   Smokeless tobacco: Never  Vaping Use   Vaping status: Never Used  Substance Use Topics   Alcohol use: No   Drug use: No       OPHTHALMIC EXAM:  Not recorded    IMAGING AND PROCEDURES  Imaging and Procedures for 05/01/2024            ASSESSMENT/PLAN:  No diagnosis found.  1. BRVO w/ CME OD  - delayed to follow up from 10 weeks to 12 weeks (03.26.25 - 06.17.25) - s/p IVA OD #1 (02.14.23), #2 (03.14.23), #3 (04.11.23), #4 (05.26.23), #5 (06.23.23), #6 (07.28.23), #7 (09.11.23), #8 (11.14.23), #9 (12.29.23), #10 (02.12.24), #11 (04.02.24), #12 (05.28.24), #13 (07.08.24), #14 (08.19.24)  -- IVA resistance ==================== - s/p IVE OD #1 (09.30.24), #2 (11.11.24), #3 (12.16.24), #4 (01.27.25), #5 (03.26.25), #6 (06.17.25), #7 (08.19.25)  -- IVE resistance ==================== - s/p IVV OD #1 (10.14.25), #2 (12.10.25) **hx of increased IRF at 12 weeks on 06.17.25 exam (IVE)** - BCVA OD 20/20 stable  - OCT shows OD trace cystic changes/edema IN fovea and macula--improved, stable improvement in foveal contour at 8 wks - recommend IVV OD #3 today, 02.10.26 w/ f/u in 8 wks - pt wishes to proceed with injection - RBA of procedure discussed, questions answered - Eylea  informed consent obtained and signed OD 10.30.24 - Vabysmo  informed consent obtained and signed OD 10.14.25 - see procedure note  - Eylea  and Vabysmo  both approved for 2025 -- injection is covered by insurance co pay - F/U 8 weeks -- DFE/OCT/possible injection  2,3. Lattice degeneration w/ atrophic holes, left eye - pigmented lattice at 1230  with retinal holes - s/p laser retinopexy OS (02.21.23) - good laser surrounding - monitor  4. PVD / vitreous syneresis OU - OD with stable release of VMA to full PVD -- symptomatic flashes and floaters improved  - OS - stable PVD  - Discussed findings and prognosis  - No RT or RD on 360 exam OU  - Reviewed s/s of RT/RD  - strict return precautions for any such RT/RD symptoms  - monitor  5. Age related macular degeneration, non-exudative, both eyes - The incidence, anatomy, and pathology of dry AMD, risk of progression, and the AREDS and AREDS 2 study including smoking risks discussed with patient.   -  continue Amsler grid monitoring  - monitor  6. Pseudophakia OU  - s/p CE/IOL OD (04.18.24, Dr. Austin)  - IOL in good position, doing well  - monitor  7. 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    No follow-ups on file.  There are no Patient Instructions on file for this visit.   Explained the diagnoses, plan, and follow up with the patient and they expressed understanding.  Patient expressed understanding of the importance of proper follow up care.    This document serves as a record of services personally performed by Redell JUDITHANN Hans, MD, PhD. It was created on their behalf by Wanda GEANNIE Keens, COT an ophthalmic technician. The creation of this record is the provider's dictation and/or activities during the visit.    Electronically signed by:  Wanda GEANNIE Keens, COT  04/17/24 10:08 AM  Redell JUDITHANN Hans, M.D., Ph.D. Diseases & Surgery of the Retina and Vitreous [facility]    Abbreviations: M myopia (nearsighted); A astigmatism; H hyperopia (farsighted); P presbyopia; Mrx spectacle prescription;  CTL contact lenses; OD right eye; OS left eye; OU both eyes  XT exotropia; ET esotropia; PEK punctate epithelial keratitis; PEE punctate epithelial erosions; DES dry eye syndrome; MGD meibomian gland dysfunction; ATs artificial tears; PFAT's preservative free artificial tears; NSC nuclear sclerotic cataract; PSC posterior subcapsular cataract; ERM epi-retinal membrane; PVD posterior vitreous detachment; RD retinal detachment; DM diabetes mellitus; DR diabetic retinopathy; NPDR non-proliferative diabetic retinopathy; PDR proliferative diabetic retinopathy; CSME clinically significant macular edema; DME  diabetic macular edema; dbh dot blot hemorrhages; CWS cotton wool spot; POAG primary open angle glaucoma; C/D cup-to-disc ratio; HVF humphrey visual field; GVF goldmann visual field; OCT optical coherence tomography; IOP intraocular pressure; BRVO Branch retinal vein occlusion; CRVO central retinal vein occlusion; CRAO central retinal artery occlusion; BRAO branch retinal artery occlusion; RT retinal tear; SB scleral buckle; PPV pars plana vitrectomy; VH Vitreous hemorrhage; PRP panretinal laser photocoagulation; IVK intravitreal kenalog; VMT vitreomacular traction; MH Macular hole;  NVD neovascularization of the disc; NVE neovascularization elsewhere; AREDS age related eye disease study; ARMD age related macular degeneration; POAG primary open angle glaucoma; EBMD epithelial/anterior basement membrane dystrophy; ACIOL anterior chamber intraocular lens; IOL intraocular lens; PCIOL posterior chamber intraocular lens; Phaco/IOL phacoemulsification with intraocular lens placement; PRK photorefractive keratectomy; LASIK laser assisted in situ keratomileusis; HTN hypertension; DM diabetes mellitus; COPD chronic obstructive pulmonary disease "

## 2024-05-01 ENCOUNTER — Encounter (INDEPENDENT_AMBULATORY_CARE_PROVIDER_SITE_OTHER): Admitting: Ophthalmology

## 2024-05-01 DIAGNOSIS — H34831 Tributary (branch) retinal vein occlusion, right eye, with macular edema: Secondary | ICD-10-CM

## 2024-05-01 DIAGNOSIS — H43813 Vitreous degeneration, bilateral: Secondary | ICD-10-CM

## 2024-05-01 DIAGNOSIS — H33322 Round hole, left eye: Secondary | ICD-10-CM

## 2024-05-01 DIAGNOSIS — D3131 Benign neoplasm of right choroid: Secondary | ICD-10-CM

## 2024-05-01 DIAGNOSIS — Z961 Presence of intraocular lens: Secondary | ICD-10-CM

## 2024-05-01 DIAGNOSIS — H35412 Lattice degeneration of retina, left eye: Secondary | ICD-10-CM

## 2024-05-01 DIAGNOSIS — H353132 Nonexudative age-related macular degeneration, bilateral, intermediate dry stage: Secondary | ICD-10-CM
